# Patient Record
Sex: Female | Born: 1970 | Race: Black or African American | Hispanic: No | Marital: Single | Smoking: Never smoker
Health system: Southern US, Community
[De-identification: ages and names within clinical notes are randomized; demographics above are authoritative.]

## PROBLEM LIST (undated history)

## (undated) DIAGNOSIS — G7111 Myotonic muscular dystrophy: Secondary | ICD-10-CM

## (undated) DIAGNOSIS — J189 Pneumonia, unspecified organism: Secondary | ICD-10-CM

## (undated) DIAGNOSIS — F99 Mental disorder, not otherwise specified: Secondary | ICD-10-CM

## (undated) DIAGNOSIS — D219 Benign neoplasm of connective and other soft tissue, unspecified: Secondary | ICD-10-CM

## (undated) HISTORY — PX: OTHER SURGICAL HISTORY: SHX169

## (undated) HISTORY — DX: Myotonic muscular dystrophy: G71.11

---

## 2000-06-08 ENCOUNTER — Encounter: Payer: Self-pay | Admitting: Ophthalmology

## 2000-06-11 ENCOUNTER — Ambulatory Visit (HOSPITAL_COMMUNITY): Admission: RE | Admit: 2000-06-11 | Discharge: 2000-06-11 | Payer: Self-pay | Admitting: Ophthalmology

## 2001-04-26 ENCOUNTER — Ambulatory Visit (HOSPITAL_COMMUNITY): Admission: RE | Admit: 2001-04-26 | Discharge: 2001-04-26 | Payer: Self-pay | Admitting: Ophthalmology

## 2001-05-17 ENCOUNTER — Ambulatory Visit (HOSPITAL_COMMUNITY): Admission: RE | Admit: 2001-05-17 | Discharge: 2001-05-17 | Payer: Self-pay | Admitting: Ophthalmology

## 2003-03-13 ENCOUNTER — Other Ambulatory Visit: Admission: RE | Admit: 2003-03-13 | Discharge: 2003-03-13 | Payer: Self-pay | Admitting: Obstetrics and Gynecology

## 2003-04-02 ENCOUNTER — Emergency Department (HOSPITAL_COMMUNITY): Admission: EM | Admit: 2003-04-02 | Discharge: 2003-04-02 | Payer: Self-pay | Admitting: Family Medicine

## 2003-11-16 ENCOUNTER — Emergency Department (HOSPITAL_COMMUNITY): Admission: EM | Admit: 2003-11-16 | Discharge: 2003-11-16 | Payer: Self-pay | Admitting: Family Medicine

## 2004-03-29 ENCOUNTER — Emergency Department (HOSPITAL_COMMUNITY): Admission: EM | Admit: 2004-03-29 | Discharge: 2004-03-29 | Payer: Self-pay | Admitting: Family Medicine

## 2004-05-17 ENCOUNTER — Ambulatory Visit (HOSPITAL_COMMUNITY): Admission: RE | Admit: 2004-05-17 | Discharge: 2004-05-17 | Payer: Self-pay | Admitting: Internal Medicine

## 2004-10-31 ENCOUNTER — Other Ambulatory Visit: Admission: RE | Admit: 2004-10-31 | Discharge: 2004-10-31 | Payer: Self-pay | Admitting: Obstetrics and Gynecology

## 2004-11-20 ENCOUNTER — Ambulatory Visit: Payer: Self-pay | Admitting: Gastroenterology

## 2005-04-04 ENCOUNTER — Ambulatory Visit: Payer: Self-pay | Admitting: Gastroenterology

## 2005-04-07 ENCOUNTER — Ambulatory Visit: Payer: Self-pay | Admitting: Gastroenterology

## 2005-06-29 ENCOUNTER — Emergency Department (HOSPITAL_COMMUNITY): Admission: EM | Admit: 2005-06-29 | Discharge: 2005-06-29 | Payer: Self-pay | Admitting: Emergency Medicine

## 2005-07-09 ENCOUNTER — Ambulatory Visit (HOSPITAL_COMMUNITY): Admission: RE | Admit: 2005-07-09 | Discharge: 2005-07-09 | Payer: Self-pay | Admitting: Internal Medicine

## 2006-04-17 ENCOUNTER — Emergency Department (HOSPITAL_COMMUNITY): Admission: EM | Admit: 2006-04-17 | Discharge: 2006-04-17 | Payer: Self-pay | Admitting: Family Medicine

## 2006-12-17 ENCOUNTER — Ambulatory Visit (HOSPITAL_COMMUNITY): Admission: RE | Admit: 2006-12-17 | Discharge: 2006-12-17 | Payer: Self-pay | Admitting: Ophthalmology

## 2007-01-26 ENCOUNTER — Ambulatory Visit (HOSPITAL_COMMUNITY): Admission: RE | Admit: 2007-01-26 | Discharge: 2007-01-26 | Payer: Self-pay | Admitting: Ophthalmology

## 2007-03-26 ENCOUNTER — Encounter: Admission: RE | Admit: 2007-03-26 | Discharge: 2007-03-26 | Payer: Self-pay | Admitting: Obstetrics and Gynecology

## 2007-04-02 ENCOUNTER — Encounter (INDEPENDENT_AMBULATORY_CARE_PROVIDER_SITE_OTHER): Payer: Self-pay | Admitting: Obstetrics and Gynecology

## 2007-04-02 ENCOUNTER — Ambulatory Visit (HOSPITAL_COMMUNITY): Admission: RE | Admit: 2007-04-02 | Discharge: 2007-04-02 | Payer: Self-pay | Admitting: Obstetrics and Gynecology

## 2008-06-19 ENCOUNTER — Encounter: Admission: RE | Admit: 2008-06-19 | Discharge: 2008-06-19 | Payer: Self-pay | Admitting: Obstetrics and Gynecology

## 2008-12-29 ENCOUNTER — Emergency Department (HOSPITAL_COMMUNITY): Admission: EM | Admit: 2008-12-29 | Discharge: 2008-12-29 | Payer: Self-pay | Admitting: Emergency Medicine

## 2010-03-03 ENCOUNTER — Encounter: Payer: Self-pay | Admitting: Obstetrics and Gynecology

## 2010-03-03 ENCOUNTER — Encounter: Payer: Self-pay | Admitting: Internal Medicine

## 2010-06-25 NOTE — H&P (Signed)
NAMEHENDY, King                 ACCOUNT NO.:  [id number]   MEDICAL RECORD NO.:  [id number]          PATIENT TYPE:  AMB   LOCATION:  SDC                           FACILITY:  [facility]   PHYSICIAN:  Janine Limbo, M.D.DATE OF BIRTH:  [birth year]   DATE OF ADMISSION:  04/02/2007  DATE OF DISCHARGE:                              HISTORY & PHYSICAL   HISTORY OF PRESENT ILLNESS:  [name] is a 40 year old female, gravida  0, who presents for hysteroscopy with resection of a fibroid. She will  also have a dilatation and curettage and an endometrial ablation using  ThermaChoice.  The patient has been followed at the [facility] of [facility].  The patient is  mentally challenged and she is cared for by her mother.  The patient has  a history of fibroids and complains of menorrhagia and dysmenorrhea.  She does not tolerate exams well in the office.  Her most recent Pap  smear is within normal limits.  The patient is not sexually active and  there is no known history of abuse.   DRUG ALLERGIES:  No known drug allergies.   PAST MEDICAL HISTORY:  The patient is mentally challenged and she is  under constant care.   SOCIAL HISTORY:  The patient's mother denies cigarette use, alcohol use,  and recreational drug use.   REVIEW OF SYSTEMS:  Please see history of present illness.   FAMILY HISTORY:  Noncontributory.   PHYSICAL EXAM:  Weight is 109 pounds.  HEENT:  Within normal limits.  CHEST:  Clear.  HEART:  Regular rate and rhythm.  BREASTS:  Without masses.  ABDOMEN:  Nontender.  EXTREMITIES:  Grossly normal.  NEUROLOGIC:  Grossly normal except that clearly she does not comprehend  the conversations and the exam.   LABORATORY VALUES:  An ultrasound was performed and it showed an 8.46 cm  x 5.92 cm uterus.  The endometrium measured 0.46 cm. The ovaries  appeared normal.  There was a 2.30 cm fibroid noted.  A gonorrhea  culture was negative,  chlamydia culture was negative.   ASSESSMENT:  1. Fibroid uterus.  2. Dysmenorrhea.  3. Menorrhagia.  4. Mentally challenged.   PLAN:  A long discussion was held with the patient (who has limited  understanding) and her mother (health care power of attorney).  We  discussed our options for management and the mother wishes to proceed  with hysteroscopy, resection, dilatation and curettage, and endometrial  ablation.  She understands the indications for the surgical  procedure and she also understands that childbearing should not be an  option. There is no doubt that this patient should never bear children.  The mother accepts the risks of, but not limited to, anesthetic  complications, bleeding, infections, and possible damage to the  surrounding organs.      Janine Limbo, M.D.  Electronically Signed     AVS/MEDQ  D:  03/17/2007  T:  03/18/2007  Job:  [id number]

## 2010-06-25 NOTE — Op Note (Signed)
[name], [name]                 ACCOUNT NO.:  [id number]   MEDICAL RECORD NO.:  [id number]          PATIENT TYPE:  AMB   LOCATION:  SDC                           FACILITY:  [facility]   PHYSICIAN:  Janine Limbo, M.D.DATE OF BIRTH:  23-Jan-1971   DATE OF PROCEDURE:  04/02/2007  DATE OF DISCHARGE:                               OPERATIVE REPORT   PREOPERATIVE DIAGNOSES:  1. Menorrhagia  2. Dysmenorrhea.  3. Fibroid uterus.  4. Mentally challenged   POSTOPERATIVE DIAGNOSES:  1. Menorrhagia  2. Dysmenorrhea.  3. Fibroid uterus.  4. Mentally challenged   OPERATION PERFORMED:  1. Hysteroscopy.  2. Dilatation and curettage.  3. ThermaChoice endometrial ablation.   SURGEON:  Leonard Schwartz, M.D.   FIRST ASSISTANT:  None.   ANESTHESIA:  The anesthetic is general.   INDICATIONS FOR THE OPERATION:  This patient is a 40 year old female,  gravida 0, who presents with the above-mentioned diagnoses.  The patient  is mentally challenged and her mother has health care Power of  [place].  We discussed the indications and the options for management.  The risks of the above procedure were reviewed including, but not  limited to, anesthetic complications, bleeding, infection, and possible  damage to the surrounding organs.   FINDINGS:  The uterus was in normal size.  No lesions were noted in the  endometrial cavity.  No pathology was noted.   DESCRIPTION OF THE OPERATION:  The patient was brought to the operating  room where a general anesthetic was given.  The patient's abdomen,  perineum and vagina were prepped with multiple layers of Betadine.  The  bladder was drained of urine.  The patient was sterilely draped.   Examination under anesthesia was performed.  A paracervical block was  placed using 10 mL of half percent Marcaine with epinephrine.  An  endocervical curettage was obtained.  The uterus sounded to 7 cm.  The  cervix was gently dilated.   The diagnostic  hysteroscope was inserted and the cavity was carefully  inspected.  Pictures were taken.  No lesions were noted.  We were ready  then to proceed with the curettage.  The hysteroscope was removed and  the cavity was then curetted until it was felt to be clean using a small  curet.  We were then ready to proceed with the ThermaChoice procedure.   The ThermaChoice instrument was then tested and was noted to be working  in proper order.  The instrument was inserted into the uterus without  difficulty and then the cavity was ablated for total of 8 minutes.   The patient tolerated her procedure well.  Hemostasis was noted to be  adequate at the end of our procedure.  All instruments were removed.  Examination was repeated and the uterus was noted to be firm and no  masses were appreciated.  Estrogen cream was placed at the introitus to  help with healing.   The patient was awakened from her anesthetic without difficulty and  taken to the recovery room in stable condition.   Sponge, needle and instrument counts  were correct on two occasions.   ESTIMATED BLOOD LOSS:  The estimated blood loss was 5 mL.   FLUIDS REPLACED:  The estimated fluid deficit was less than 100 mL.   DISPOSITION:  The patient tolerated her procedure well.  The the patient  was taken to the recovery room in stable condition.   SPECIMENS:  The endocervical curettage and endometrial curettings were  sent to pathology for evaluation.   FOLLOW UP:  The follow up instructions are that the patient will return  to see Dr. Stefano Gaul in 2 weeks for follow up examination.   DISCHARGE INSTRUCTIONS:  The patient was given a copy of the  postoperative instruction sheet as prepared by the [facility]  [facility] for patients who have undergone dilatation and curettage.   DISCHARGE MEDICATIONS:  1. The patient was given a prescription for Motrin; and, she will take      600 mg every 6 hours as needed for mild-to-moderate  pain.  2. The patient will take Percocet 5/325, one tablet every 4 hours as      needed for severe pain.   NOTE:  The patient's mother states there is no way that she can be  sexually active and we will reiterate how important it is that this  patient never becomes pregnant in the future.      Janine Limbo, M.D.  Electronically Signed     AVS/MEDQ  D:  04/02/2007  T:  04/03/2007  Job:  [id number]

## 2010-06-28 NOTE — Op Note (Signed)
[place]  Patient:    [name], [name]                        MRN: [id number] Adm. Date:  [id number] Attending:  Karenann Cai                           Operative Report  PREOPERATIVE DIAGNOSIS:  Immature cataract, right eye.  POSTOPERATIVE DIAGNOSIS:  Immature cataract, right  eye.  PROCEDURE:  Kelman phacoemulsification of cataract, right eye.  ANESTHESIA:  General.  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 She was evaluated and found to have a visual acuity best of light perception in each eye.  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  Cataract extraction of the right eye was recommended, and she is admitted at this time for cataract extraction under general anesthesia.  DESCRIPTION OF PROCEDURE:  Under the influence of general inhalation anesthesia, the patient was prepped and draped in the usual manner.  The lid speculum was inserted under the upper and lower lid of the left eye, and a 4-0 silk traction suture was passed through the belly of the superior rectus muscle for retraction.  A fornix-based conjunctival flap was turned, and was hemostasis achieved by using cautery.  An incision was made in the sclera approximately 1 mm posterior to the limbus.  This incision was dissected down to clear cornea using the crescent blade.  A side port incision was made at the 1:30 clock hour position.  Occucoat was injected into the eye through the side port incision.  The anterior chamber was then entered through the corneoscleral tunnel incision at the 11:30 position using a 3.2 mm keratome. An anterior capsulotomy was done using a bent 25-gauge needle.  The nucleus was hydrodissected using Xylocaine.  The KPE handpiece was passed into the eye, and the nucleus  was emulsified without difficulty.  The residual cortical material was aspirated.  The posterior capsule was polished using the olive-tip polisher.  The wound was widened slightly to accommodate a foldable silicone lens.  This lens was seated into the eye behind the iris without difficulty.  The anterior chamber was reformed, and the pupil was constricted using Miochol.  The residual Occucoat was aspirated from the eye.  The wound was then tested, and there was found to be a small leak.  Therefore, the wound was closed by using a single horizontal suture of 10-0 nylon. The conjunctiva was closed using thermal cautery.  Celestone 1 cc and 0.5 cc of gentamicin were injected subconjunctivally.  Maxitrol ophthalmic ointment and Pilopine ointment were applied along with a patch and Fox shield.  The patient tolerated the procedure well, was discharged to postanesthesia recovery in satisfactory condition.  She is to be discharged when stable.  The parent is instructed to have the young lady refrain from any strenuous activity such as stooping, bending, lifting.  She is to keep the eye patched until I see her tomorrow for further evaluation.  DISCHARGE DIAGNOSIS:  Immature cataract, left eye. DD:  06/11/00 TD:  06/13/00 Job: [id number]

## 2010-06-28 NOTE — Op Note (Signed)
[place]  Patient:    [name], [name] Visit Number: [id number] MRN: [id number]          Service Type: DSU Location: [facility] 2852 [id number] Attending Physician:  Karenann Cai Dictated by:   Marya Landry Carlyle Lipa., M.D. Admit Date:  05/17/2001                             Operative Report  PREOPERATIVE DIAGNOSIS:  Mature cataract, left eye.  POSTOPERATIVE DIAGNOSIS:  Mature cataract, left eye.  PROCEDURE:  Kelman phacoemulsification of cataract, left eye.  ANESTHESIA:  General.  JUSTIFICATION FOR PROCEDURE:  This is a 40 year old retarded female who underwent a cataract extraction from the right eye approximately one year ago. At that time she had dense cataract in the left eye also, but she was lost to follow-up.  She presented recently, at which time she was able to see much better than she had been prior to her previous surgery, but the cataract in the left eye had indeed gotten worse.   Cataract extraction from the left eye was recommended.  She had a visual acuity best corrected to light perception in that left eye.  She is admitted at this time for that purpose.  DESCRIPTION OF PROCEDURE:  Under the influence of general inhalation anesthesia, the patient was prepped and draped in the usual manner.  The lid speculum was inserted under the upper and lower lid of the left eye, and a 4-0 silk traction suture was passed through the belly of the superior rectus muscle for traction.  A fornix-based conjunctival flap was turned and hemostasis was achieved by using cautery.  An incision made in the sclera approximately 1 mm posterior to the limbus.  This incision was dissected down to clear cornea using the crescent blade.  A side port incision was made at the 1:30 clock hour position.  Occucoat was injected into the eye through the side port incision.  The anterior chamber was then entered through the corneoscleral tunnel incision at  the 11:30 position.   An anterior capsulotomy was done using a bent 25-gauge needle.  The lens was then phacoemulsified using full aspiration and phaco power without complications.  The posterior capsule was polished using the olive-tip polisher.  The wound was widened slightly to accommodate a foldable silicone lens.  This lens was seated into the eye behind the iris without difficulty.  The anterior chamber was reformed, and the pupil was constricted using Miochol.  The residual Occucoat was aspirated from the eye.  The lips of the wound were hydrated with saline and tested to make sure that there was no leak.  After ascertaining that there was no leak, the conjunctiva was closed over the wounds using thermal cautery. Celestone 1 cc and 0.5 cc of gentamicin were injected subconjunctivally. Maxitrol ophthalmic ointment and Pilopine ointment were applied along with a patch and Fox shield.  The patient tolerated the procedure well and was discharged to postanesthesia recovery room in satisfactory condition.  The family was instructed that she was to refrain from any strenuous activity such as stooping, bending, or lifting.   She was to take Vicodin every four hours as needed for pain, and to see me in my office tomorrow for further evaluation.  DISCHARGE DIAGNOSIS:  Mature cataract, left eye. Dictated by:   Marya Landry Carlyle Lipa., M.D. Attending Physician:  Karenann Cai DD:  05/17/01 TD:  05/18/01 Job: [id number]

## 2010-11-01 LAB — URINE MICROSCOPIC-ADD ON

## 2010-11-01 LAB — CBC
Hemoglobin: 11.7 — ABNORMAL LOW
MCHC: 32.8
MCV: 79.8
RBC: 4.47
WBC: 4.1

## 2010-11-01 LAB — URINALYSIS, ROUTINE W REFLEX MICROSCOPIC
Glucose, UA: NEGATIVE
Ketones, ur: NEGATIVE
Specific Gravity, Urine: >1.03 — ABNORMAL HIGH
pH: 5.5

## 2011-07-24 ENCOUNTER — Other Ambulatory Visit: Payer: Self-pay | Admitting: Internal Medicine

## 2011-07-24 DIAGNOSIS — Z1231 Encounter for screening mammogram for malignant neoplasm of breast: Secondary | ICD-10-CM

## 2011-08-19 ENCOUNTER — Ambulatory Visit
Admission: RE | Admit: 2011-08-19 | Discharge: 2011-08-19 | Disposition: A | Discharge status: Home / Self Care | Payer: Medicaid Other | Source: Ambulatory Visit | Attending: Internal Medicine | Admitting: Internal Medicine

## 2011-08-19 DIAGNOSIS — Z1231 Encounter for screening mammogram for malignant neoplasm of breast: Secondary | ICD-10-CM

## 2011-09-02 ENCOUNTER — Encounter (HOSPITAL_COMMUNITY): Payer: Self-pay

## 2011-09-02 ENCOUNTER — Emergency Department (HOSPITAL_COMMUNITY)
Admission: EM | Admit: 2011-09-02 | Discharge: 2011-09-02 | Disposition: A | Discharge status: Home / Self Care | Payer: Medicaid Other | Source: Home / Self Care | Attending: Emergency Medicine | Admitting: Emergency Medicine

## 2011-09-02 ENCOUNTER — Emergency Department (INDEPENDENT_AMBULATORY_CARE_PROVIDER_SITE_OTHER): Payer: Medicaid Other

## 2011-09-02 DIAGNOSIS — J209 Acute bronchitis, unspecified: Secondary | ICD-10-CM

## 2011-09-02 HISTORY — DX: Pneumonia, unspecified organism: J18.9

## 2011-09-02 HISTORY — DX: Benign neoplasm of connective and other soft tissue, unspecified: D21.9

## 2011-09-02 MED ORDER — BENZONATATE 200 MG PO CAPS: 200.0000 mg | ORAL_CAPSULE | Freq: Three times a day (TID) | ORAL | Status: AC | PRN

## 2011-09-02 MED ORDER — AZITHROMYCIN 250 MG PO TABS: ORAL_TABLET | ORAL | Status: AC

## 2011-09-02 NOTE — ED Notes (Signed)
Family member states pt has runny nose, chest congestion and non-productive cough for 2 weeks.  States she has had pneumonia in the past.  Appetite like normal, subjective low grade fever last week.

## 2011-09-02 NOTE — ED Provider Notes (Signed)
Chief Complaint  Patient presents with  . Cough    History of Present Illness:   [name] is a 41 year old female with a two-week history of a loose, rattly cough. She denies any sputum production. She's had no chest tightness, wheezing, or chest pain. No history of asthma or cigarette smoking. She's also had a low-grade fever, rhinorrhea, and a fever blister on her lip. She denies sore throat, postnasal drip, hoarseness, nasal congestion, headache, abdominal pain, nausea, vomiting, or diarrhea.  Review of Systems:  Other than noted above, the patient denies any of the following symptoms. Systemic:  No fever, chills, sweats, fatigue, myalgias, headache, or anorexia. Eye:  No redness, pain or drainage. ENT:  No earache, ear congestion, nasal congestion, sneezing, rhinorrhea, sinus pressure, sinus pain, post nasal drip, or sore throat. Lungs:  No cough, sputum production, wheezing, shortness of breath, or chest pain. GI:  No abdominal pain, nausea, vomiting, or diarrhea. Skin:  No rash or itching.  PMFSH:  Past medical history, family history, social history, meds, and allergies were reviewed.  Physical Exam:   Vital signs:  BP 136/80  Pulse 77  Temp 98.6 F (37 C) (Oral)  Resp 16  SpO2 99%  LMP 08/19/2011 General:  Alert, in no distress. Eye:  No conjunctival injection or drainage. Lids were normal. ENT:  TMs and canals were normal, without erythema or inflammation.  Nasal mucosa was clear and uncongested, without drainage.  Mucous membranes were moist.  Pharynx was clear, without exudate or drainage.  There were no oral ulcerations or lesions. Neck:  Supple, no adenopathy, tenderness or mass. Lungs:  No respiratory distress.  Lungs were clear to auscultation, without wheezes, rales or rhonchi.  Breath sounds were clear and equal bilaterally. Lungs were resonant to percussion.  No egophony. Heart:  Regular rhythm, without gallops, murmers or rubs. Skin:  Clear, warm, and dry, without rash  or lesions.  Radiology:  Dg Chest 2 View  09/02/2011  *RADIOLOGY REPORT*  Clinical Data: Chest congestion, cough and low grade fever.  CHEST - 2 VIEW  Comparison: PA and lateral chest 07/09/2005.  Findings: Lungs are clear.  Heart size is normal.  No pneumothorax or pleural fluid.  Thoracolumbar scoliosis noted.  IMPRESSION: No acute finding.  Scoliosis.  Original Report Authenticated By: Bernadene Stepanian. Maricela Curet, M.D.    Assessment:  The encounter diagnosis was Acute bronchitis.  Plan:   1.  The following meds were prescribed:   New Prescriptions   AZITHROMYCIN (ZITHROMAX Z-PAK) 250 MG TABLET    Take as directed.   BENZONATATE (TESSALON) 200 MG CAPSULE    Take 1 capsule (200 mg total) by mouth 3 (three) times daily as needed for cough.   2.  The patient was instructed in symptomatic care and handouts were given. 3.  The patient was told to return if becoming worse in any way, if no better in 3 or 4 days, and given some red flag symptoms that would indicate earlier return.   Reuben Likes, MD 09/02/11 2029

## 2012-09-22 ENCOUNTER — Other Ambulatory Visit: Payer: Self-pay | Admitting: Family Medicine

## 2012-09-22 DIAGNOSIS — Z1231 Encounter for screening mammogram for malignant neoplasm of breast: Secondary | ICD-10-CM

## 2012-10-08 ENCOUNTER — Ambulatory Visit: Payer: Medicaid Other

## 2012-10-29 ENCOUNTER — Other Ambulatory Visit: Payer: Self-pay | Admitting: Orthopedic Surgery

## 2012-10-29 ENCOUNTER — Ambulatory Visit
Admission: RE | Admit: 2012-10-29 | Discharge: 2012-10-29 | Disposition: A | Discharge status: Home / Self Care | Payer: Medicaid Other | Source: Ambulatory Visit | Attending: Orthopedic Surgery | Admitting: Orthopedic Surgery

## 2012-10-29 DIAGNOSIS — M25661 Stiffness of right knee, not elsewhere classified: Secondary | ICD-10-CM

## 2012-10-29 DIAGNOSIS — R52 Pain, unspecified: Secondary | ICD-10-CM

## 2013-04-01 ENCOUNTER — Inpatient Hospital Stay: Admission: RE | Admit: 2013-04-01 | Payer: Medicaid Other | Source: Ambulatory Visit

## 2013-06-21 ENCOUNTER — Other Ambulatory Visit (HOSPITAL_COMMUNITY): Payer: Self-pay | Admitting: Family Medicine

## 2013-06-21 DIAGNOSIS — Z1231 Encounter for screening mammogram for malignant neoplasm of breast: Secondary | ICD-10-CM

## 2013-06-28 ENCOUNTER — Ambulatory Visit (HOSPITAL_COMMUNITY): Payer: Medicaid Other | Attending: Family Medicine

## 2013-09-29 ENCOUNTER — Ambulatory Visit: Payer: Self-pay | Admitting: Obstetrics & Gynecology

## 2014-01-13 ENCOUNTER — Other Ambulatory Visit: Payer: Self-pay | Admitting: Family Medicine

## 2014-01-13 DIAGNOSIS — Z1231 Encounter for screening mammogram for malignant neoplasm of breast: Secondary | ICD-10-CM

## 2014-01-23 ENCOUNTER — Emergency Department (HOSPITAL_COMMUNITY)
Admission: EM | Admit: 2014-01-23 | Discharge: 2014-01-23 | Disposition: A | Discharge status: Home / Self Care | Payer: Medicaid Other | Source: Home / Self Care | Attending: Emergency Medicine | Admitting: Emergency Medicine

## 2014-01-23 ENCOUNTER — Encounter (HOSPITAL_COMMUNITY): Payer: Self-pay | Admitting: Emergency Medicine

## 2014-01-23 DIAGNOSIS — J019 Acute sinusitis, unspecified: Secondary | ICD-10-CM

## 2014-01-23 DIAGNOSIS — J209 Acute bronchitis, unspecified: Secondary | ICD-10-CM

## 2014-01-23 DIAGNOSIS — J069 Acute upper respiratory infection, unspecified: Secondary | ICD-10-CM

## 2014-01-23 MED ORDER — TRAMADOL HCL 50 MG PO TABS: ORAL_TABLET | ORAL | Status: DC

## 2014-01-23 MED ORDER — AMOXICILLIN 500 MG PO CAPS: 500.0000 mg | ORAL_CAPSULE | Freq: Three times a day (TID) | ORAL | Status: DC

## 2014-01-23 NOTE — ED Provider Notes (Signed)
  Chief Complaint   URI   History of Present Illness   [name] is a 43 year old developmentally delayed female who comes in today with her group home worker. The group home worker states that she's had a one-week history of cough productive yellow sputum, fatigue, has complained of being cold and hot. She is not complaining of any pain anywhere, specifically no headache, stiff neck, chest pain, or abdominal pain. She's not had knee object and fever. She's had no nasal congestion, rhinorrhea, earache, sore throat, or difficulty breathing. No nausea, vomiting, or diarrhea.  Review of Systems   Other than as noted above, the patient denies any of the following symptoms: Systemic:  No fevers, chills, sweats, or myalgias. Eye:  No redness or discharge. ENT:  No ear pain, headache, nasal congestion, drainage, sinus pressure, or sore throat. Neck:  No neck pain, stiffness, or swollen glands. Lungs:  No cough, sputum production, hemoptysis, wheezing, chest tightness, shortness of breath or chest pain. GI:  No abdominal pain, nausea, vomiting or diarrhea.  [place]   Past medical history, family history, social history, meds, and allergies were reviewed.   Physical exam   Vital signs:  BP 121/84 mmHg  Pulse 84  Temp(Src) 98.2 F (36.8 C) (Oral)  Resp 16  SpO2 97% General:  Alert and oriented.  In no distress.  Skin warm and dry. Eye:  No conjunctival injection or drainage. Lids were normal. ENT:  TMs and canals were normal, without erythema or inflammation.  Nasal mucosa was clear and uncongested, without drainage.  Mucous membranes were moist.  Pharynx was clear with no exudate or drainage.  There were no oral ulcerations or lesions. Neck:  Supple, no adenopathy, tenderness or mass. Lungs:  No respiratory distress.  Lungs were clear to auscultation, without wheezes, rales or rhonchi.  Breath sounds were clear and equal bilaterally.  Heart:  Regular rhythm, without gallops, murmers or  rubs. Skin:  Clear, warm, and dry, without rash or lesions.   Assessment     The primary encounter diagnosis was Viral URI. Diagnoses of Acute sinusitis, recurrence not specified, unspecified location and Acute bronchitis, unspecified organism were also pertinent to this visit.  There is no evidence of pneumonia, strep throat, sinusitis, otitis media.    Plan    1.  Meds:  The following meds were prescribed:   Discharge Medication List as of 01/23/2014 12:29 PM    START taking these medications   Details  amoxicillin (AMOXIL) 500 MG capsule Take 1 capsule (500 mg total) by mouth 3 (three) times daily., Starting 01/23/2014, Until Discontinued, Normal    traMADol (ULTRAM) 50 MG tablet 1 to 2 every 8 hours as needed for cough, Print        2.  Patient Education/Counseling:  The patient was given appropriate handouts, self care instructions, and instructed in symptomatic relief.  Instructed to get extra fluids and extra rest.    3.  Follow up:  The patient was told to follow up here if no better in 3 to 4 days, or sooner if becoming worse in any way, and given some red flag symptoms such as increasing fever, difficulty breathing, chest pain, or persistent vomiting which would prompt immediate return.       Harden Mo, MD 01/23/14 [phone]

## 2014-01-23 NOTE — Discharge Instructions (Signed)
Acute Bronchitis Bronchitis is when the airways that extend from the windpipe into the lungs get red, puffy, and painful (inflamed). Bronchitis often causes thick spit (mucus) to develop. This leads to a cough. A cough is the most common symptom of bronchitis. In acute bronchitis, the condition usually begins suddenly and goes away over time (usually in 2 weeks). Smoking, allergies, and asthma can make bronchitis worse. Repeated episodes of bronchitis may cause more lung problems. HOME CARE  Rest.  Drink enough fluids to keep your pee (urine) clear or pale yellow (unless you need to limit fluids as told by your doctor).  Only take over-the-counter or prescription medicines as told by your doctor.  Avoid smoking and secondhand smoke. These can make bronchitis worse. If you are a smoker, think about using nicotine gum or skin patches. Quitting smoking will help your lungs heal faster.  Reduce the chance of getting bronchitis again by:  Washing your hands often.  Avoiding people with cold symptoms.  Trying not to touch your hands to your mouth, nose, or eyes.  Follow up with your doctor as told. GET HELP IF: Your symptoms do not improve after 1 week of treatment. Symptoms include:  Cough.  Fever.  Coughing up thick spit.  Body aches.  Chest congestion.  Chills.  Shortness of breath.  Sore throat. GET HELP RIGHT AWAY IF:   You have an increased fever.  You have chills.  You have severe shortness of breath.  You have bloody thick spit (sputum).  You throw up (vomit) often.  You lose too much body fluid (dehydration).  You have a severe headache.  You faint. MAKE SURE YOU:   Understand these instructions.  Will watch your condition.  Will get help right away if you are not doing well or get worse. Document Released: 07/16/2007 Document Revised: 09/29/2012 Document Reviewed: 07/20/2012 [facility] Patient Information 2015 [place], [place]. This information is not  intended to replace advice given to you by your health care provider. Make sure you discuss any questions you have with your health care provider.  Sinusitis Sinusitis is redness, soreness, and inflammation of the paranasal sinuses. Paranasal sinuses are air pockets within the bones of your face (beneath the eyes, the middle of the forehead, or above the eyes). In healthy paranasal sinuses, mucus is able to drain out, and air is able to circulate through them by way of your nose. However, when your paranasal sinuses are inflamed, mucus and air can become trapped. This can allow bacteria and other germs to grow and cause infection. Sinusitis can develop quickly and last only a short time (acute) or continue over a long period (chronic). Sinusitis that lasts for more than 12 weeks is considered chronic.  CAUSES  Causes of sinusitis include:  Allergies.  Structural abnormalities, such as displacement of the cartilage that separates your nostrils (deviated septum), which can decrease the air flow through your nose and sinuses and affect sinus drainage.  Functional abnormalities, such as when the small hairs (cilia) that line your sinuses and help remove mucus do not work properly or are not present. SIGNS AND SYMPTOMS  Symptoms of acute and chronic sinusitis are the same. The primary symptoms are pain and pressure around the affected sinuses. Other symptoms include:  Upper toothache.  Earache.  Headache.  Bad breath.  Decreased sense of smell and taste.  A cough, which worsens when you are lying flat.  Fatigue.  Fever.  Thick drainage from your nose, which often is green  and may contain pus (purulent).  Swelling and warmth over the affected sinuses. DIAGNOSIS  Your health care provider will perform a physical exam. During the exam, your health care provider may:  Look in your nose for signs of abnormal growths in your nostrils (nasal polyps).  Tap over the affected sinus to check  for signs of infection.  View the inside of your sinuses (endoscopy) using an imaging device that has a light attached (endoscope). If your health care provider suspects that you have chronic sinusitis, one or more of the following tests may be recommended:  Allergy tests.  Nasal culture. A sample of mucus is taken from your nose, sent to a lab, and screened for bacteria.  Nasal cytology. A sample of mucus is taken from your nose and examined by your health care provider to determine if your sinusitis is related to an allergy. TREATMENT  Most cases of acute sinusitis are related to a viral infection and will resolve on their own within 10 days. Sometimes medicines are prescribed to help relieve symptoms (pain medicine, decongestants, nasal steroid sprays, or saline sprays).  However, for sinusitis related to a bacterial infection, your health care provider will prescribe antibiotic medicines. These are medicines that will help kill the bacteria causing the infection.  Rarely, sinusitis is caused by a fungal infection. In theses cases, your health care provider will prescribe antifungal medicine. For some cases of chronic sinusitis, surgery is needed. Generally, these are cases in which sinusitis recurs more than 3 times per year, despite other treatments. HOME CARE INSTRUCTIONS   Drink plenty of water. Water helps thin the mucus so your sinuses can drain more easily.  Use a humidifier.  Inhale steam 3 to 4 times a day (for example, sit in the bathroom with the shower running).  Apply a warm, moist washcloth to your face 3 to 4 times a day, or as directed by your health care provider.  Use saline nasal sprays to help moisten and clean your sinuses.  Take medicines only as directed by your health care provider.  If you were prescribed either an antibiotic or antifungal medicine, finish it all even if you start to feel better. SEEK IMMEDIATE MEDICAL CARE IF:  You have increasing pain or  severe headaches.  You have nausea, vomiting, or drowsiness.  You have swelling around your face.  You have vision problems.  You have a stiff neck.  You have difficulty breathing. MAKE SURE YOU:   Understand these instructions.  Will watch your condition.  Will get help right away if you are not doing well or get worse. Document Released: 01/27/2005 Document Revised: 06/13/2013 Document Reviewed: 02/11/2011 [facility] Patient Information 2015 [place], [place]. This information is not intended to replace advice given to you by your health care provider. Make sure you discuss any questions you have with your health care provider.

## 2014-01-23 NOTE — ED Notes (Signed)
Chest congestion, blowing yellow from nose, no fever, no specific complaints, but " just tired".  Onset last week

## 2014-02-01 ENCOUNTER — Ambulatory Visit: Payer: Medicaid Other

## 2014-02-06 ENCOUNTER — Encounter: Payer: Self-pay | Admitting: *Deleted

## 2014-02-07 ENCOUNTER — Encounter: Payer: Self-pay | Admitting: Obstetrics & Gynecology

## 2014-03-08 ENCOUNTER — Ambulatory Visit
Admission: RE | Admit: 2014-03-08 | Discharge: 2014-03-08 | Disposition: A | Discharge status: Home / Self Care | Payer: Medicaid Other | Source: Ambulatory Visit | Attending: Family Medicine | Admitting: Family Medicine

## 2014-03-08 DIAGNOSIS — Z1231 Encounter for screening mammogram for malignant neoplasm of breast: Secondary | ICD-10-CM

## 2014-04-25 ENCOUNTER — Other Ambulatory Visit (HOSPITAL_COMMUNITY): Payer: Self-pay | Admitting: Cardiology

## 2014-04-25 DIAGNOSIS — I078 Other rheumatic tricuspid valve diseases: Secondary | ICD-10-CM

## 2014-05-02 ENCOUNTER — Ambulatory Visit (HOSPITAL_COMMUNITY)
Admission: RE | Admit: 2014-05-02 | Discharge: 2014-05-02 | Disposition: A | Discharge status: Home / Self Care | Payer: Medicaid Other | Source: Ambulatory Visit | Attending: Cardiology | Admitting: Cardiology

## 2014-05-02 DIAGNOSIS — I079 Rheumatic tricuspid valve disease, unspecified: Secondary | ICD-10-CM | POA: Diagnosis not present

## 2014-05-02 DIAGNOSIS — I078 Other rheumatic tricuspid valve diseases: Secondary | ICD-10-CM

## 2014-05-02 LAB — CREATININE, SERUM: Creatinine, Ser: 0.59 mg/dL (ref 0.50–1.10)

## 2014-05-02 MED ORDER — GADOBENATE DIMEGLUMINE 529 MG/ML IV SOLN
15.0000 mL | Freq: Once | INTRAVENOUS | Status: AC | PRN
  Administered 2014-05-02: 15 mL via INTRAVENOUS

## 2014-06-29 ENCOUNTER — Ambulatory Visit: Payer: Medicaid Other | Admitting: Certified Nurse Midwife

## 2014-07-12 ENCOUNTER — Emergency Department (INDEPENDENT_AMBULATORY_CARE_PROVIDER_SITE_OTHER): Payer: Medicaid Other

## 2014-07-12 ENCOUNTER — Emergency Department (HOSPITAL_COMMUNITY)
Admission: EM | Admit: 2014-07-12 | Discharge: 2014-07-12 | Disposition: A | Discharge status: Home / Self Care | Payer: Medicaid Other | Source: Home / Self Care | Attending: Family Medicine | Admitting: Family Medicine

## 2014-07-12 ENCOUNTER — Encounter (HOSPITAL_COMMUNITY): Payer: Self-pay | Admitting: *Deleted

## 2014-07-12 DIAGNOSIS — J189 Pneumonia, unspecified organism: Secondary | ICD-10-CM

## 2014-07-12 MED ORDER — LEVOFLOXACIN 500 MG PO TABS: 500.0000 mg | ORAL_TABLET | Freq: Every day | ORAL | Status: DC

## 2014-07-12 MED ORDER — TRAMADOL HCL 50 MG PO TABS: 50.0000 mg | ORAL_TABLET | Freq: Every evening | ORAL | Status: DC | PRN

## 2014-07-12 NOTE — ED Notes (Signed)
Unable  To   Sign  Pt   Out  As  Discharge  Template   Would not  Pull  Up    Plan of  Care  Verbalized   With    Pt

## 2014-07-12 NOTE — ED Notes (Signed)
Pt   Reports         Symptoms  Of  Cough  /  Congested          X  1  Week        The  Cough  Is  Non productive     The  Symptoms    Are  Not  releived  By  otc  meds

## 2014-07-12 NOTE — ED Provider Notes (Signed)
[name] is a 44 y.o. female who presents to Urgent Care today for cough congestion and headache cold symptoms present for one week. Cough is nonproductive. No fevers or chills nausea vomiting or diarrhea. Cough is bothersome especially at night.   Past Medical History  Diagnosis Date  . Pneumonia   . Fibroids    Past Surgical History  Procedure Laterality Date  . Thumb surgery     History  Substance Use Topics  . Smoking status: Never Smoker   . Smokeless tobacco: Not on file  . Alcohol Use: No   ROS as above Medications: No current facility-administered medications for this encounter.   Current Outpatient Prescriptions  Medication Sig Dispense Refill  . amoxicillin (AMOXIL) 500 MG capsule Take 1 capsule (500 mg total) by mouth 3 (three) times daily. 30 capsule 0  . levofloxacin (LEVAQUIN) 500 MG tablet Take 1 tablet (500 mg total) by mouth daily. 7 tablet 0  . Multiple Vitamin (MULTIVITAMIN) capsule Take 1 capsule by mouth daily.    . traMADol (ULTRAM) 50 MG tablet Take 1 tablet (50 mg total) by mouth at bedtime as needed (cough). 10 tablet 0   No Known Allergies   Exam:  BP 135/82 mmHg  Pulse 97  Temp(Src) 97.9 F (36.6 C) (Oral)  Resp 12  SpO2 97%  LMP 07/12/2014 Gen: Well NAD HEENT: EOMI,  MMM clear nasal discharge Lungs: Normal work of breathing. CTABL Heart: RRR no MRG Abd: NABS, Soft. Nondistended, Nontender Exts: Brisk capillary refill, warm and well perfused.   No results found for this or any previous visit (from the past 24 hour(s)). Dg Chest 2 View  07/12/2014   CLINICAL DATA:  Cough and fever for 1 month.  EXAM: CHEST  2 VIEW  COMPARISON:  09/02/2011  FINDINGS: Pulmonary airspace disease is seen in both lower lobes, left side greater than right, which is new since previous study, suspicious for bilateral pneumonia.  No evidence of pleural effusion. Heart size is within normal limits. Mild thoracic dextroscoliosis again noted.  IMPRESSION: Left  greater than right lower lobe airspace disease, suspicious for bilateral pneumonia. Followup PA and lateral chest X-ray is recommended in 3-4 weeks following trial of antibiotic therapy to ensure resolution and exclude underlying malignancy.   Electronically Signed   By: Earle Gell M.D.   On: 07/12/2014 14:13    Assessment and Plan: 44 y.o. female with immediate or pneumonia. Treat with Levaquin. Tramadol for cough suppression. Continue over-the-counter medications as needed. Return as needed.  Discussed warning signs or symptoms. Please see discharge instructions. Patient expresses understanding.     Gregor Hams, MD 07/12/14 [phone]

## 2014-07-12 NOTE — Discharge Instructions (Signed)
Thank you for coming in today. Call or go to the emergency room if you get worse, have trouble breathing, have chest pains, or palpitations.  Use tramadol at bedtime for cough as needed.  Follow up with primary doctor in 3-4 weeks for repeat xray.   Pneumonia Pneumonia is an infection of the lungs.  CAUSES Pneumonia may be caused by bacteria or a virus. Usually, these infections are caused by breathing infectious particles into the lungs (respiratory tract). SIGNS AND SYMPTOMS   Cough.  Fever.  Chest pain.  Increased rate of breathing.  Wheezing.  Mucus production. DIAGNOSIS  If you have the common symptoms of pneumonia, your health care provider will typically confirm the diagnosis with a chest X-ray. The X-ray will show an abnormality in the lung (pulmonary infiltrate) if you have pneumonia. Other tests of your blood, urine, or sputum may be done to find the specific cause of your pneumonia. Your health care provider may also do tests (blood gases or pulse oximetry) to see how well your lungs are working. TREATMENT  Some forms of pneumonia may be spread to other people when you cough or sneeze. You may be asked to wear a mask before and during your exam. Pneumonia that is caused by bacteria is treated with antibiotic medicine. Pneumonia that is caused by the influenza virus may be treated with an antiviral medicine. Most other viral infections must run their course. These infections will not respond to antibiotics.  HOME CARE INSTRUCTIONS   Cough suppressants may be used if you are losing too much rest. However, coughing protects you by clearing your lungs. You should avoid using cough suppressants if you can.  Your health care provider may have prescribed medicine if he or she thinks your pneumonia is caused by bacteria or influenza. Finish your medicine even if you start to feel better.  Your health care provider may also prescribe an expectorant. This loosens the mucus to be  coughed up.  Take medicines only as directed by your health care provider.  Do not smoke. Smoking is a common cause of bronchitis and can contribute to pneumonia. If you are a smoker and continue to smoke, your cough may last several weeks after your pneumonia has cleared.  A cold steam vaporizer or humidifier in your room or home may help loosen mucus.  Coughing is often worse at night. Sleeping in a semi-upright position in a recliner or using a couple pillows under your head will help with this.  Get rest as you feel it is needed. Your body will usually let you know when you need to rest. PREVENTION A pneumococcal shot (vaccine) is available to prevent a common bacterial cause of pneumonia. This is usually suggested for:  People over 70 years old.  Patients on chemotherapy.  People with chronic lung problems, such as bronchitis or emphysema.  People with immune system problems. If you are over 65 or have a high risk condition, you may receive the pneumococcal vaccine if you have not received it before. In some countries, a routine influenza vaccine is also recommended. This vaccine can help prevent some cases of pneumonia.You may be offered the influenza vaccine as part of your care. If you smoke, it is time to quit. You may receive instructions on how to stop smoking. Your health care provider can provide medicines and counseling to help you quit. SEEK MEDICAL CARE IF: You have a fever. SEEK IMMEDIATE MEDICAL CARE IF:   Your illness becomes worse. This is  especially true if you are elderly or weakened from any other disease.  You cannot control your cough with suppressants and are losing sleep.  You begin coughing up blood.  You develop pain which is getting worse or is uncontrolled with medicines.  Any of the symptoms which initially brought you in for treatment are getting worse rather than better.  You develop shortness of breath or chest pain. MAKE SURE YOU:    Understand these instructions.  Will watch your condition.  Will get help right away if you are not doing well or get worse. Document Released: 01/27/2005 Document Revised: 06/13/2013 Document Reviewed: 04/18/2010 [facility] Patient Information 2015 [place], [place]. This information is not intended to replace advice given to you by your health care provider. Make sure you discuss any questions you have with your health care provider.

## 2014-07-13 ENCOUNTER — Ambulatory Visit: Payer: Self-pay | Admitting: Certified Nurse Midwife

## 2014-07-14 ENCOUNTER — Ambulatory Visit (INDEPENDENT_AMBULATORY_CARE_PROVIDER_SITE_OTHER): Payer: Medicaid Other | Admitting: Certified Nurse Midwife

## 2014-07-14 ENCOUNTER — Encounter: Payer: Self-pay | Admitting: Certified Nurse Midwife

## 2014-07-14 VITALS — BP 124/81 | HR 92 | Temp 97.9°F | Wt 96.0 lb

## 2014-07-14 DIAGNOSIS — J189 Pneumonia, unspecified organism: Secondary | ICD-10-CM

## 2014-07-14 DIAGNOSIS — L97519 Non-pressure chronic ulcer of other part of right foot with unspecified severity: Secondary | ICD-10-CM

## 2014-07-14 DIAGNOSIS — Z0000 Encounter for general adult medical examination without abnormal findings: Secondary | ICD-10-CM

## 2014-07-14 DIAGNOSIS — F79 Unspecified intellectual disabilities: Secondary | ICD-10-CM | POA: Insufficient documentation

## 2014-07-14 DIAGNOSIS — Z01419 Encounter for gynecological examination (general) (routine) without abnormal findings: Secondary | ICD-10-CM

## 2014-07-14 LAB — COMPREHENSIVE METABOLIC PANEL
ALT: 18 U/L (ref 0–35)
AST: 31 U/L (ref 0–37)
Albumin: 3.8 g/dL (ref 3.5–5.2)
Alkaline Phosphatase: 75 U/L (ref 39–117)
BUN: 9 mg/dL (ref 6–23)
CALCIUM: 9.3 mg/dL (ref 8.4–10.5)
CO2: 28 meq/L (ref 19–32)
Chloride: 95 mEq/L — ABNORMAL LOW (ref 96–112)
Creat: 0.5 mg/dL (ref 0.50–1.10)
GLUCOSE: 81 mg/dL (ref 70–99)
POTASSIUM: 4.7 meq/L (ref 3.5–5.3)
Sodium: 139 mEq/L (ref 135–145)
Total Bilirubin: 0.3 mg/dL (ref 0.2–1.2)
Total Protein: 8.1 g/dL (ref 6.0–8.3)

## 2014-07-14 LAB — CBC WITH DIFFERENTIAL/PLATELET
BASOS ABS: 0 10*3/uL (ref 0.0–0.1)
BASOS PCT: 0 % (ref 0–1)
Eosinophils Absolute: 0 10*3/uL (ref 0.0–0.7)
Eosinophils Relative: 1 % (ref 0–5)
HCT: 36.5 % (ref 36.0–46.0)
HEMOGLOBIN: 11 g/dL — AB (ref 12.0–15.0)
LYMPHS PCT: 28 % (ref 12–46)
Lymphs Abs: 1.3 10*3/uL (ref 0.7–4.0)
MCH: 23.7 pg — AB (ref 26.0–34.0)
MCHC: 30.1 g/dL (ref 30.0–36.0)
MCV: 78.7 fL (ref 78.0–100.0)
MONO ABS: 0.3 10*3/uL (ref 0.1–1.0)
MONOS PCT: 7 % (ref 3–12)
MPV: 9.3 fL (ref 8.6–12.4)
NEUTROS ABS: 3 10*3/uL (ref 1.7–7.7)
Neutrophils Relative %: 64 % (ref 43–77)
PLATELETS: 702 10*3/uL — AB (ref 150–400)
RBC: 4.64 MIL/uL (ref 3.87–5.11)
RDW: 16.1 % — ABNORMAL HIGH (ref 11.5–15.5)
WBC: 4.7 10*3/uL (ref 4.0–10.5)

## 2014-07-14 LAB — HIV ANTIBODY (ROUTINE TESTING W REFLEX): HIV: NONREACTIVE

## 2014-07-14 LAB — TSH: TSH: 1.278 u[IU]/mL (ref 0.350–4.500)

## 2014-07-14 MED ORDER — ALBUTEROL SULFATE HFA 108 (90 BASE) MCG/ACT IN AERS: 2.0000 | INHALATION_SPRAY | Freq: Four times a day (QID) | RESPIRATORY_TRACT | Status: DC | PRN

## 2014-07-14 MED ORDER — PRENATE MINI 18-0.6-0.4-350 MG PO CAPS: 1.0000 | ORAL_CAPSULE | Freq: Every day | ORAL | Status: DC

## 2014-07-14 MED ORDER — DEXTROMETHORPHAN-GUAIFENESIN 20-400 MG PO TABS: 2.0000 | ORAL_TABLET | Freq: Four times a day (QID) | ORAL | Status: DC | PRN

## 2014-07-14 NOTE — Progress Notes (Signed)
Patient ID: [name], female   DOB: [birth year], 44 y.o.   MRN: [id number]    Subjective:      [name] is a 44 y.o. female here for a routine exam.  Current complaints: recently dx with pneumonia.  Used to see Dr. Delsa Sale.  Not sexually active.  Lives with mom.  Periods last about 4-5, sometimes has cramping.  Denies any heavy clots does have some heavy bleeding in the beginning.  History given by mother.    Mother had BCA dx in her 65's and had radiation.  Mother desires PNV for her daughter.  Patient has slight mental retardation and CP.  Her mother desires full STD screening and annual examination.    Personal health questionnaire:  Is patient [name], have a family history of breast and/or ovarian cancer: yes Is there a family history of uterine cancer diagnosed at age < 62, gastrointestinal cancer, urinary tract cancer, family member who is a Field seismologist syndrome-associated carrier: no Is the patient overweight and hypertensive, family history of diabetes, personal history of gestational diabetes, preeclampsia or PCOS: no Is patient over 34, have PCOS,  family history of premature CHD under age 9, diabetes, smoke, have hypertension or peripheral artery disease:  no At any time, has a partner hit, kicked or otherwise hurt or frightened you?: no Over the past 2 weeks, have you felt down, depressed or hopeless?: no Over the past 2 weeks, have you felt little interest or pleasure in doing things?:no   Gynecologic History Patient's last menstrual period was 07/12/2014. Contraception: abstinence Last Pap: unknown. Results were: normal according to patients mother Last mammogram: 02/2014. Results were: normal  Obstetric History OB History  No data available  Never been pregnant  Past Medical History  Diagnosis Date  . Pneumonia   . Fibroids     Past Surgical History  Procedure Laterality Date  . Thumb surgery       Current outpatient prescriptions:  .   levofloxacin (LEVAQUIN) 500 MG tablet, Take 1 tablet (500 mg total) by mouth daily., Disp: 7 tablet, Rfl: 0 .  traMADol (ULTRAM) 50 MG tablet, Take 1 tablet (50 mg total) by mouth at bedtime as needed (cough)., Disp: 10 tablet, Rfl: 0 .  albuterol (PROVENTIL HFA;VENTOLIN HFA) 108 (90 BASE) MCG/ACT inhaler, Inhale 2 puffs into the lungs every 6 (six) hours as needed for wheezing or shortness of breath., Disp: 1 Inhaler, Rfl: 2 .  amoxicillin (AMOXIL) 500 MG capsule, Take 1 capsule (500 mg total) by mouth 3 (three) times daily., Disp: 30 capsule, Rfl: 0 .  Dextromethorphan-Guaifenesin 20-400 MG TABS, Take 2 tablets by mouth every 6 (six) hours as needed., Disp: 60 each, Rfl: 0 .  Multiple Vitamin (MULTIVITAMIN) capsule, Take 1 capsule by mouth daily., Disp: , Rfl:  .  Prenat-FeCbn-FeAsp-Meth-FA-DHA (PRENATE MINI) 18-0.6-0.4-350 MG CAPS, Take 1 capsule by mouth daily., Disp: 30 capsule, Rfl: 12 No Known Allergies  History  Substance Use Topics  . Smoking status: Never Smoker   . Smokeless tobacco: Not on file  . Alcohol Use: No    History reviewed. No pertinent family history.    Review of Systems  Constitutional: negative for fatigue and weight loss Respiratory: + for cough and wheezing Cardiovascular: negative for chest pain, fatigue and palpitations Gastrointestinal: negative for abdominal pain and change in bowel habits Musculoskeletal:negative for myalgias Neurological: negative for gait problems and tremors Behavioral/Psych: negative for abusive relationship, depression Endocrine: negative for temperature intolerance   Genitourinary:negative for  abnormal menstrual periods, genital lesions, hot flashes, sexual problems and vaginal discharge Integument/breast: negative for breast lump, breast tenderness, nipple discharge and skin lesion(s)    Objective:       BP 124/81 mmHg  Pulse 92  Temp(Src) 97.9 F (36.6 C)  Wt 96 lb (43.545 kg)  LMP 07/12/2014 General:   alert  Skin:    no rash, + right foot ulceration   Lungs:   + crackles with occasional wheezing/coughing  Heart:   regular rate and rhythm, S1, S2 normal, no murmur, click, rub or gallop  Breasts:   normal without suspicious masses, skin or nipple changes or axillary nodes  Abdomen:  normal findings: no organomegaly, soft, non-tender and no hernia  Pelvis:  External genitalia: normal general appearance Urinary system: urethral meatus normal and bladder without fullness, nontender Vaginal: normal without tenderness, induration or masses Cervix: normal appearance, lower in the vaginal vault.  Nulliparous.  Adnexa: normal bimanual exam Uterus: anteverted and non-tender, normal size   Lab Review Urine pregnancy test Labs reviewed yes Radiologic studies reviewed yes  50% of 30 min visit spent on counseling and coordination of care.   Assessment:    Healthy female exam.   Right foot healed ulceration ?scar from a shoe Recent dx of pneumonia   Plan:    Education reviewed: depression evaluation, self breast exams and skin cancer screening. Contraception: abstinence. Follow up in: 1 year.  Not due for mammogram until Jan. 2017  Meds ordered this encounter  Medications  . albuterol (PROVENTIL HFA;VENTOLIN HFA) 108 (90 BASE) MCG/ACT inhaler    Sig: Inhale 2 puffs into the lungs every 6 (six) hours as needed for wheezing or shortness of breath.    Dispense:  1 Inhaler    Refill:  2  . Dextromethorphan-Guaifenesin 20-400 MG TABS    Sig: Take 2 tablets by mouth every 6 (six) hours as needed.    Dispense:  60 each    Refill:  0  . Prenat-FeCbn-FeAsp-Meth-FA-DHA (PRENATE MINI) 18-0.6-0.4-350 MG CAPS    Sig: Take 1 capsule by mouth daily.    Dispense:  30 capsule    Refill:  12   Orders Placed This Encounter  Procedures  . SureSwab, Vaginosis/Vaginitis Plus  . HIV antibody (with reflex)  . CBC with Differential/Platelet  . Comprehensive metabolic panel  . TSH  . Hepatitis B surface antigen  .  RPR  . Hepatitis C antibody  . Hemoglobin A1C  . Ambulatory referral to Dermatology    Referral Priority:  Routine    Referral Type:  Consultation    Referral Reason:  Specialty Services Required    Requested Specialty:  Dermatology    Number of Visits Requested:  1

## 2014-07-15 LAB — HEMOGLOBIN A1C
Hgb A1c MFr Bld: 5.4 % (ref <5.7)
MEAN PLASMA GLUCOSE: 108 mg/dL (ref <117)

## 2014-07-15 LAB — RPR

## 2014-07-15 LAB — HEPATITIS B SURFACE ANTIGEN: Hepatitis B Surface Ag: NEGATIVE

## 2014-07-15 LAB — HEPATITIS C ANTIBODY: HCV Ab: NEGATIVE

## 2014-07-19 ENCOUNTER — Other Ambulatory Visit: Payer: Self-pay | Admitting: Certified Nurse Midwife

## 2014-07-19 DIAGNOSIS — N760 Acute vaginitis: Secondary | ICD-10-CM

## 2014-07-19 DIAGNOSIS — B9689 Other specified bacterial agents as the cause of diseases classified elsewhere: Secondary | ICD-10-CM

## 2014-07-19 LAB — PAP IG AND HPV HIGH-RISK: HPV DNA HIGH RISK: NOT DETECTED

## 2014-07-19 LAB — SURESWAB, VAGINOSIS/VAGINITIS PLUS
Atopobium vaginae: DETECTED Log (cells/mL)
C. ALBICANS, DNA: NOT DETECTED
C. GLABRATA, DNA: NOT DETECTED
C. PARAPSILOSIS, DNA: NOT DETECTED
C. trachomatis RNA, TMA: NOT DETECTED
C. tropicalis, DNA: NOT DETECTED
GARDNERELLA VAGINALIS: 5.8 Log (cells/mL)
LACTOBACILLUS SPECIES: NOT DETECTED Log (cells/mL)
MEGASPHAERA SPECIES: NOT DETECTED Log (cells/mL)
N. GONORRHOEAE RNA, TMA: NOT DETECTED
T. vaginalis RNA, QL TMA: NOT DETECTED

## 2014-07-19 MED ORDER — TINIDAZOLE 500 MG PO TABS: ORAL_TABLET | ORAL | Status: DC

## 2014-07-21 ENCOUNTER — Telehealth: Payer: Self-pay

## 2014-07-21 ENCOUNTER — Other Ambulatory Visit: Payer: Self-pay | Admitting: *Deleted

## 2014-07-21 DIAGNOSIS — N760 Acute vaginitis: Secondary | ICD-10-CM

## 2014-07-21 DIAGNOSIS — B9689 Other specified bacterial agents as the cause of diseases classified elsewhere: Secondary | ICD-10-CM

## 2014-07-21 MED ORDER — METRONIDAZOLE 500 MG PO TABS: 500.0000 mg | ORAL_TABLET | Freq: Two times a day (BID) | ORAL | Status: DC

## 2014-07-21 MED ORDER — TINIDAZOLE 500 MG PO TABS: ORAL_TABLET | ORAL | Status: DC

## 2014-07-21 NOTE — Progress Notes (Signed)
Metronidazole sent to pharmacy due to insurance.

## 2014-07-21 NOTE — Telephone Encounter (Signed)
patient has dermatology appt with Dr. Mickel Fuchs, [facility], on 08/24/14, 8am and has been notified

## 2015-02-21 ENCOUNTER — Other Ambulatory Visit: Payer: Self-pay

## 2015-02-21 DIAGNOSIS — Z1231 Encounter for screening mammogram for malignant neoplasm of breast: Secondary | ICD-10-CM

## 2015-03-13 ENCOUNTER — Ambulatory Visit
Admission: RE | Admit: 2015-03-13 | Discharge: 2015-03-13 | Disposition: A | Discharge status: Home / Self Care | Payer: Medicaid Other | Source: Ambulatory Visit

## 2015-03-13 DIAGNOSIS — Z1231 Encounter for screening mammogram for malignant neoplasm of breast: Secondary | ICD-10-CM

## 2015-04-24 ENCOUNTER — Emergency Department (INDEPENDENT_AMBULATORY_CARE_PROVIDER_SITE_OTHER)
Admission: EM | Admit: 2015-04-24 | Discharge: 2015-04-24 | Disposition: A | Discharge status: Other Acute Inpatient Hospital | Payer: Medicaid Other | Source: Home / Self Care | Attending: Family Medicine | Admitting: Family Medicine

## 2015-04-24 ENCOUNTER — Encounter (HOSPITAL_COMMUNITY): Payer: Self-pay | Admitting: *Deleted

## 2015-04-24 ENCOUNTER — Encounter (HOSPITAL_COMMUNITY): Payer: Self-pay

## 2015-04-24 ENCOUNTER — Emergency Department (INDEPENDENT_AMBULATORY_CARE_PROVIDER_SITE_OTHER): Payer: Medicaid Other

## 2015-04-24 DIAGNOSIS — Z79899 Other long term (current) drug therapy: Secondary | ICD-10-CM | POA: Insufficient documentation

## 2015-04-24 DIAGNOSIS — Z8701 Personal history of pneumonia (recurrent): Secondary | ICD-10-CM | POA: Insufficient documentation

## 2015-04-24 DIAGNOSIS — J159 Unspecified bacterial pneumonia: Secondary | ICD-10-CM | POA: Insufficient documentation

## 2015-04-24 DIAGNOSIS — Z86018 Personal history of other benign neoplasm: Secondary | ICD-10-CM | POA: Diagnosis not present

## 2015-04-24 DIAGNOSIS — J189 Pneumonia, unspecified organism: Secondary | ICD-10-CM | POA: Diagnosis not present

## 2015-04-24 DIAGNOSIS — R05 Cough: Secondary | ICD-10-CM | POA: Diagnosis present

## 2015-04-24 HISTORY — DX: Mental disorder, not otherwise specified: F99

## 2015-04-24 LAB — CBC WITH DIFFERENTIAL/PLATELET
Basophils Absolute: 0 10*3/uL (ref 0.0–0.1)
Basophils Relative: 0 %
EOS ABS: 0 10*3/uL (ref 0.0–0.7)
EOS PCT: 0 %
HCT: 33.1 % — ABNORMAL LOW (ref 36.0–46.0)
Hemoglobin: 9.5 g/dL — ABNORMAL LOW (ref 12.0–15.0)
LYMPHS ABS: 1.2 10*3/uL (ref 0.7–4.0)
Lymphocytes Relative: 19 %
MCH: 23.3 pg — ABNORMAL LOW (ref 26.0–34.0)
MCHC: 28.7 g/dL — AB (ref 30.0–36.0)
MCV: 81.1 fL (ref 78.0–100.0)
Monocytes Absolute: 0.4 10*3/uL (ref 0.1–1.0)
Monocytes Relative: 6 %
Neutro Abs: 4.6 10*3/uL (ref 1.7–7.7)
Neutrophils Relative %: 75 %
Platelets: 526 10*3/uL — ABNORMAL HIGH (ref 150–400)
RBC: 4.08 MIL/uL (ref 3.87–5.11)
RDW: 15.9 % — ABNORMAL HIGH (ref 11.5–15.5)
WBC: 6.2 10*3/uL (ref 4.0–10.5)

## 2015-04-24 LAB — COMPREHENSIVE METABOLIC PANEL
ALK PHOS: 60 U/L (ref 38–126)
ALT: 15 U/L (ref 14–54)
AST: 30 U/L (ref 15–41)
Albumin: 2.9 g/dL — ABNORMAL LOW (ref 3.5–5.0)
Anion gap: 13 (ref 5–15)
BILIRUBIN TOTAL: 0.4 mg/dL (ref 0.3–1.2)
BUN: 7 mg/dL (ref 6–20)
CALCIUM: 9 mg/dL (ref 8.9–10.3)
CO2: 29 mmol/L (ref 22–32)
Chloride: 101 mmol/L (ref 101–111)
Creatinine, Ser: 0.61 mg/dL (ref 0.44–1.00)
GFR calc non Af Amer: >60 mL/min (ref >60)
Glucose, Bld: 101 mg/dL — ABNORMAL HIGH (ref 65–99)
Potassium: 3.1 mmol/L — ABNORMAL LOW (ref 3.5–5.1)
SODIUM: 143 mmol/L (ref 135–145)
Total Protein: 7.1 g/dL (ref 6.5–8.1)

## 2015-04-24 LAB — I-STAT CG4 LACTIC ACID, ED: Lactic Acid, Venous: 0.77 mmol/L (ref 0.5–2.0)

## 2015-04-24 NOTE — ED Notes (Addendum)
Pt reports non-productive cough, onset about a week ago. Today she went to [facility] and was diagnosed with pneumonia and sent here. Denies pain.

## 2015-04-24 NOTE — ED Provider Notes (Addendum)
CSN: [id number]     Arrival date & time 04/24/15  1442 History   None    Chief Complaint  Patient presents with  . Cough   (Consider location/radiation/quality/duration/timing/severity/associated sxs/prior Treatment) Patient is a 45 y.o. female presenting with cough. The history is provided by the patient.  Cough Cough characteristics:  Non-productive Severity:  Moderate Onset quality:  Gradual Duration:  1 week Progression:  Unchanged Chronicity:  New Smoker: no   Context: upper respiratory infection   Relieved by:  None tried Worsened by:  Nothing tried Ineffective treatments:  None tried Associated symptoms: no chest pain, no fever, no rhinorrhea, no shortness of breath, no sinus congestion and no wheezing     Past Medical History  Diagnosis Date  . Pneumonia   . Fibroids   . Mental disorder    Past Surgical History  Procedure Laterality Date  . Thumb surgery     History reviewed. No pertinent family history. Social History  Substance Use Topics  . Smoking status: Never Smoker   . Smokeless tobacco: None  . Alcohol Use: No   OB History    No data available     Review of Systems  Constitutional: Negative.  Negative for fever.  HENT: Negative.  Negative for rhinorrhea.   Respiratory: Positive for cough. Negative for shortness of breath and wheezing.   Cardiovascular: Negative for chest pain.  All other systems reviewed and are negative.   Allergies  Review of patient's allergies indicates no known allergies.  Home Medications   Prior to Admission medications   Medication Sig Start Date End Date Taking? Authorizing Provider  albuterol (PROVENTIL HFA;VENTOLIN HFA) 108 (90 BASE) MCG/ACT inhaler Inhale 2 puffs into the lungs every 6 (six) hours as needed for wheezing or shortness of breath. 07/14/14   Rachelle A Denney, CNM  amoxicillin (AMOXIL) 500 MG capsule Take 1 capsule (500 mg total) by mouth 3 (three) times daily. 01/23/14   Harden Mo, MD   benzonatate (TESSALON) 100 MG capsule Take 1 capsule (100 mg total) by mouth 3 (three) times daily as needed for cough. 04/25/15   Kristen N Ward, DO  Dextromethorphan-Guaifenesin 20-400 MG TABS Take 2 tablets by mouth every 6 (six) hours as needed. 07/14/14   Rachelle A Denney, CNM  levofloxacin (LEVAQUIN) 750 MG tablet Take 1 tablet (750 mg total) by mouth daily. 04/25/15   Kristen N Ward, DO  metroNIDAZOLE (FLAGYL) 500 MG tablet Take 1 tablet (500 mg total) by mouth 2 (two) times daily. 07/21/14   Rachelle A Denney, CNM  Multiple Vitamin (MULTIVITAMIN) capsule Take 1 capsule by mouth daily.    Historical Provider, MD  Prenat-FeCbn-FeAsp-Meth-FA-DHA (PRENATE MINI) 18-0.6-0.4-350 MG CAPS Take 1 capsule by mouth daily. 07/14/14   Rachelle A Denney, CNM  traMADol (ULTRAM) 50 MG tablet Take 1 tablet (50 mg total) by mouth at bedtime as needed (cough). 07/12/14   Gregor Hams, MD   Meds Ordered and Administered this Visit  Medications - No data to display  BP 127/85 mmHg  Pulse 85  Temp(Src) 98.2 F (36.8 C) (Oral)  Resp 16  SpO2 96% No data found.   Physical Exam  Constitutional: She appears well-developed and well-nourished. No distress.  HENT:  Mouth/Throat: Oropharynx is clear and moist.  Neck: Normal range of motion. Neck supple.  Cardiovascular: Normal rate, normal heart sounds and intact distal pulses.   Pulmonary/Chest: Effort normal. She has decreased breath sounds. She has rhonchi. She exhibits no tenderness.  Lymphadenopathy:  She has no cervical adenopathy.  Neurological: She is alert.  Skin: Skin is warm and dry.  Nursing note and vitals reviewed.   ED Course  Procedures (including critical care time)  Labs Review Labs Reviewed - No data to display  Imaging Review No results found. X-rays reviewed and report per radiologist.   Visual Acuity Review  Right Eye Distance:   Left Eye Distance:   Bilateral Distance:    Right Eye Near:   Left Eye Near:    Bilateral  Near:         MDM   1. CAP (community acquired pneumonia)     Sent for eval of recurrent cap.   Billy Fischer, MD 04/24/15 1754  Billy Fischer, MD 05/01/15 1640  Billy Fischer, MD 05/01/15 [phone]

## 2015-04-24 NOTE — ED Notes (Signed)
Pt  Reports      Symptoms    Of  Cough  Congested        Pt  Reports         Symptoms     Of   Non  Productive  Cough         Symptoms   X    1  Week  The  Cough  Is  Rattling   And  The  Symptoms  Started  Off as  A  Cold

## 2015-04-25 ENCOUNTER — Emergency Department (HOSPITAL_COMMUNITY)
Admission: EM | Admit: 2015-04-25 | Discharge: 2015-04-25 | Disposition: A | Discharge status: Home / Self Care | Payer: Medicaid Other | Attending: Emergency Medicine | Admitting: Emergency Medicine

## 2015-04-25 DIAGNOSIS — J189 Pneumonia, unspecified organism: Secondary | ICD-10-CM

## 2015-04-25 MED ORDER — LEVOFLOXACIN 750 MG PO TABS: 750.0000 mg | ORAL_TABLET | Freq: Every day | ORAL | Status: DC

## 2015-04-25 MED ORDER — BENZONATATE 100 MG PO CAPS
ORAL_CAPSULE | ORAL | Status: AC
  Filled 2015-04-25: qty 1

## 2015-04-25 MED ORDER — LEVOFLOXACIN 750 MG PO TABS
750.0000 mg | ORAL_TABLET | Freq: Once | ORAL | Status: AC
  Administered 2015-04-25: 750 mg via ORAL

## 2015-04-25 MED ORDER — BENZONATATE 100 MG PO CAPS
100.0000 mg | ORAL_CAPSULE | Freq: Once | ORAL | Status: AC
  Administered 2015-04-25: 100 mg via ORAL

## 2015-04-25 MED ORDER — LEVOFLOXACIN 750 MG PO TABS
ORAL_TABLET | ORAL | Status: AC
  Filled 2015-04-25: qty 1

## 2015-04-25 MED ORDER — BENZONATATE 100 MG PO CAPS: 100.0000 mg | ORAL_CAPSULE | Freq: Three times a day (TID) | ORAL | Status: DC | PRN

## 2015-04-25 NOTE — Discharge Instructions (Signed)
You may alternate between Tylenol 1000 mg every 6 hours as needed for fever and pain and ibuprofen 800 mg every 8 hours as needed for fever and pain. Please follow-up with your primary care physician in one week.   Community-Acquired Pneumonia, Adult Pneumonia is an infection of the lungs. There are different types of pneumonia. One type can develop while a person is in a hospital. A different type, called community-acquired pneumonia, develops in people who are not, or have not recently been, in the hospital or other health care facility.  CAUSES Pneumonia may be caused by bacteria, viruses, or funguses. Community-acquired pneumonia is often caused by Streptococcus pneumonia bacteria. These bacteria are often passed from one person to another by breathing in droplets from the cough or sneeze of an infected person. RISK FACTORS The condition is more likely to develop in:  People who havechronic diseases, such as chronic obstructive pulmonary disease (COPD), asthma, congestive heart failure, cystic fibrosis, diabetes, or kidney disease.  People who haveearly-stage or late-stage HIV.  People who havesickle cell disease.  People who havehad their spleen removed (splenectomy).  People who havepoor Human resources officer.  People who havemedical conditions that increase the risk of breathing in (aspirating) secretions their own mouth and nose.   People who havea weakened immune system (immunocompromised).  People who smoke.  People whotravel to areas where pneumonia-causing germs commonly exist.  People whoare around animal habitats or animals that have pneumonia-causing germs, including birds, bats, rabbits, cats, and farm animals. SYMPTOMS Symptoms of this condition include:  Adry cough.  A wet (productive) cough.  Fever.  Sweating.  Chest pain, especially when breathing deeply or coughing.  Rapid breathing or difficulty breathing.  Shortness of breath.  Shaking  chills.  Fatigue.  Muscle aches. DIAGNOSIS Your health care provider will take a medical history and perform a physical exam. You may also have other tests, including:  Imaging studies of your chest, including X-rays.  Tests to check your blood oxygen level and other blood gases.  Other tests on blood, mucus (sputum), fluid around your lungs (pleural fluid), and urine. If your pneumonia is severe, other tests may be done to identify the specific cause of your illness. TREATMENT The type of treatment that you receive depends on many factors, such as the cause of your pneumonia, the medicines you take, and other medical conditions that you have. For most adults, treatment and recovery from pneumonia may occur at home. In some cases, treatment must happen in a hospital. Treatment may include:  Antibiotic medicines, if the pneumonia was caused by bacteria.  Antiviral medicines, if the pneumonia was caused by a virus.  Medicines that are given by mouth or through an IV tube.  Oxygen.  Respiratory therapy. Although rare, treating severe pneumonia may include:  Mechanical ventilation. This is done if you are not breathing well on your own and you cannot maintain a safe blood oxygen level.  Thoracentesis. This procedureremoves fluid around one lung or both lungs to help you breathe better. HOME CARE INSTRUCTIONS  Take over-the-counter and prescription medicines only as told by your health care provider.  Only takecough medicine if you are losing sleep. Understand that cough medicine can prevent your body's natural ability to remove mucus from your lungs.  If you were prescribed an antibiotic medicine, take it as told by your health care provider. Do not stop taking the antibiotic even if you start to feel better.  Sleep in a semi-upright position at night. Try sleeping in  a reclining chair, or place a few pillows under your head.  Do not use tobacco products, including cigarettes,  chewing tobacco, and e-cigarettes. If you need help quitting, ask your health care provider.  Drink enough water to keep your urine clear or pale yellow. This will help to thin out mucus secretions in your lungs. PREVENTION There are ways that you can decrease your risk of developing community-acquired pneumonia. Consider getting a pneumococcal vaccine if:  You are older than 47 years of age.  You are older than 47 years of age and are undergoing cancer treatment, have chronic lung disease, or have other medical conditions that affect your immune system. Ask your health care provider if this applies to you. There are different types and schedules of pneumococcal vaccines. Ask your health care provider which vaccination option is best for you. You may also prevent community-acquired pneumonia if you take these actions:  Get an influenza vaccine every year. Ask your health care provider which type of influenza vaccine is best for you.  Go to the dentist on a regular basis.  Wash your hands often. Use hand sanitizer if soap and water are not available. SEEK MEDICAL CARE IF:  You have a fever.  You are losing sleep because you cannot control your cough with cough medicine. SEEK IMMEDIATE MEDICAL CARE IF:  You have worsening shortness of breath.  You have increased chest pain.  Your sickness becomes worse, especially if you are an older adult or have a weakened immune system.  You cough up blood.   This information is not intended to replace advice given to you by your health care provider. Make sure you discuss any questions you have with your health care provider.   Document Released: 01/27/2005 Document Revised: 10/18/2014 Document Reviewed: 05/24/2014 Elsevier Interactive Patient Education [employer].

## 2015-04-25 NOTE — ED Provider Notes (Signed)
By signing my name below, I, Forrestine Him, attest that this documentation has been prepared under the direction and in the presence of [place], DO.  Electronically Signed: Forrestine Him, ED Scribe. 04/25/2015. 3:36 AM.   TIME SEEN: 3:31 AM   CHIEF COMPLAINT:  Chief Complaint  Patient presents with  . 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 female with a PMHx of a mental disorder-possible mental retardation who presents to the Emergency Department complaining of constant, ongoing, worsening productive cough x 2 weeks. No aggravating or alleviating factors reported. Family also reports loss of appetite and fatigue. OTC medications attempted at home without any improvement. No recent fever or chills. Pt was evaluated by PCP on 3/10 for same and advised to continue to attempt OTC medications. Family reports that they thought at that time it was a viral illness. Pt was then evaluated at Urgent Care as cough worsened earlier this afternoon. At that time pt had an CXR performed which revealed PNA. Pt was advised to come to the Emergency Department at time of discharge for further evaluation. No known allergies to medications. Family reports she did have pneumonia in June 2016.  PCP: Elyn Peers, MD    ROS: LEVEL 5 CAVEAT DUE TO MENTAL DISABILITY  PAST MEDICAL HISTORY/PAST SURGICAL HISTORY:  Past Medical History  Diagnosis Date  . Pneumonia   . Fibroids   . Mental disorder     MEDICATIONS:  Prior to Admission medications   Medication Sig Start Date End Date Taking? Authorizing Provider  albuterol (PROVENTIL HFA;VENTOLIN HFA) 108 (90 BASE) MCG/ACT inhaler Inhale 2 puffs into the lungs every 6 (six) hours as needed for wheezing or shortness of breath. 07/14/14   Rachelle A Denney, CNM  amoxicillin (AMOXIL) 500 MG capsule Take 1 capsule (500 mg total) by mouth 3 (three) times daily. 01/23/14   Harden Mo, MD   Dextromethorphan-Guaifenesin 20-400 MG TABS Take 2 tablets by mouth every 6 (six) hours as needed. 07/14/14   Rachelle A Denney, CNM  levofloxacin (LEVAQUIN) 500 MG tablet Take 1 tablet (500 mg total) by mouth daily. 07/12/14   Gregor Hams, MD  metroNIDAZOLE (FLAGYL) 500 MG tablet Take 1 tablet (500 mg total) by mouth 2 (two) times daily. 07/21/14   Rachelle A Denney, CNM  Multiple Vitamin (MULTIVITAMIN) capsule Take 1 capsule by mouth daily.    Historical Provider, MD  Prenat-FeCbn-FeAsp-Meth-FA-DHA (PRENATE MINI) 18-0.6-0.4-350 MG CAPS Take 1 capsule by mouth daily. 07/14/14   Rachelle A Denney, CNM  traMADol (ULTRAM) 50 MG tablet Take 1 tablet (50 mg total) by mouth at bedtime as needed (cough). 07/12/14   Gregor Hams, MD    ALLERGIES:  No Known Allergies  SOCIAL HISTORY:  Social History  Substance Use Topics  . Smoking status: Never Smoker   . Smokeless tobacco: Not on file  . Alcohol Use: No    FAMILY HISTORY: No family history on file.  EXAM: BP 126/86 mmHg  Pulse 80  Temp(Src) 98.1 F (36.7 C) (Oral)  Resp 21  Ht 5\' 2"  (1.575 m)  Wt 100 lb 2 oz (45.416 kg)  BMI 18.31 kg/m2  SpO2 98% CONSTITUTIONAL: Alert but does not answer questions- history is provided by aunt; Well-appearing, well-nourished, Afebrile and nontoxic appearing, in no distress HEAD: Normocephalic EYES: Conjunctivae clear, PERRL ENT: normal nose; no rhinorrhea; moist mucous membranes NECK: Supple, no meningismus, no  LAD  CARD: RRR; 2/6 systolic murmur noted; S1 and S2 appreciated; no clicks, no rubs, no gallops RESP: Normal chest excursion without splinting or tachypnea; breath sounds clear and equal bilaterally; no wheezes, no rhonchi, no rales, no hypoxia or respiratory distress, speaking full sentences, no increased work of breathing ABD/GI: Normal bowel sounds; non-distended; soft, non-tender, no rebound, no guarding, no peritoneal signs BACK:  The back appears normal and is non-tender to palpation, there  is no CVA tenderness EXT: Normal ROM in all joints; non-tender to palpation; no edema; normal capillary refill; no cyanosis, no calf tenderness or swelling    SKIN: Normal color for age and race; warm; no rash NEURO: Moves all extremities equally, sensation to light touch intact diffusely, cranial nerves II through XII intact PSYCH: The patient's mood and manner are appropriate. Grooming and personal hygiene are appropriate.  MEDICAL DECISION MAKING: Patient here with community-acquired pneumonia. Labs unremarkable. Hemodynamically stable. She has no increased work of breathing, hypoxia. Her lungs are clear. She is very well-appearing today. She is not immunocompromised, diabetic. No recent admission to the hospital to suggest a healthcare associated pneumonia. I feel she can be successfully treated as an outpatient. Will discharge on Levaquin with Tessalon Perles. Have discussed this with patient's guardian who is her aunt who feels comfortable with this plan. Dr. Criss Rosales is her primary care physician. I recommend close follow-up. Discussed at length customary and normal return precautions.  Family verbalize understanding and is comfortable with this plan.    I personally performed the services described in this documentation, which was scribed in my presence. The recorded information has been reviewed and is accurate.   [place], DO 04/25/15 [phone]

## 2016-01-01 ENCOUNTER — Ambulatory Visit: Payer: Medicaid Other | Admitting: Certified Nurse Midwife

## 2016-01-29 ENCOUNTER — Ambulatory Visit: Payer: Medicaid Other | Admitting: Certified Nurse Midwife

## 2016-02-27 ENCOUNTER — Ambulatory Visit: Payer: Medicaid Other | Admitting: Obstetrics and Gynecology

## 2016-04-02 ENCOUNTER — Ambulatory Visit (INDEPENDENT_AMBULATORY_CARE_PROVIDER_SITE_OTHER): Payer: Medicaid Other | Admitting: Certified Nurse Midwife

## 2016-04-02 ENCOUNTER — Other Ambulatory Visit: Payer: Self-pay | Admitting: Certified Nurse Midwife

## 2016-04-02 ENCOUNTER — Other Ambulatory Visit (HOSPITAL_COMMUNITY)
Admission: RE | Admit: 2016-04-02 | Discharge: 2016-04-02 | Disposition: A | Discharge status: Home / Self Care | Payer: Medicaid Other | Source: Ambulatory Visit | Attending: Obstetrics | Admitting: Obstetrics

## 2016-04-02 ENCOUNTER — Encounter: Payer: Self-pay | Admitting: Obstetrics

## 2016-04-02 VITALS — BP 131/83 | HR 76 | Wt 110.2 lb

## 2016-04-02 DIAGNOSIS — Z0000 Encounter for general adult medical examination without abnormal findings: Secondary | ICD-10-CM | POA: Diagnosis not present

## 2016-04-02 DIAGNOSIS — Z86018 Personal history of other benign neoplasm: Secondary | ICD-10-CM

## 2016-04-02 DIAGNOSIS — Z01419 Encounter for gynecological examination (general) (routine) without abnormal findings: Secondary | ICD-10-CM | POA: Diagnosis not present

## 2016-04-02 DIAGNOSIS — N951 Menopausal and female climacteric states: Secondary | ICD-10-CM

## 2016-04-02 DIAGNOSIS — N6310 Unspecified lump in the right breast, unspecified quadrant: Secondary | ICD-10-CM

## 2016-04-02 DIAGNOSIS — Z23 Encounter for immunization: Secondary | ICD-10-CM

## 2016-04-02 DIAGNOSIS — Z113 Encounter for screening for infections with a predominantly sexual mode of transmission: Secondary | ICD-10-CM | POA: Insufficient documentation

## 2016-04-02 DIAGNOSIS — Z1151 Encounter for screening for human papillomavirus (HPV): Secondary | ICD-10-CM | POA: Diagnosis not present

## 2016-04-02 NOTE — Progress Notes (Signed)
Subjective:     Patient ID: [name], female   DOB: 1970-05-04, 46 y.o.   MRN: [id number]  45 yobf with mother presented for routine gyn wellness exam, pt has h/o mentally delayed, but verbal and at baseline with communication today, pt and mother stated that over the last year she has been getting spotty menstrual cycle, pt has one menstrual cycle each month that last 4 day in duration, uses pads, but at times will skip a month, pt c/o moderate cramps to which she uses 1 pill of otc motrin which relief her pain. Pt denies taking any meds currently, is followed by cards for irregular heart beat and miurmur at times, currently pt denies n, v, d, rashes, fever, cp or sob, no dysuria, vaginal discharge or foul smells, no vaginal irritation or itching, pts mother stated pt has never been sexually active in life. Pt mother stated she was worried about her fibroids.    Gynecologic Exam  The patient's primary symptoms include missed menses. The patient's pertinent negatives include no genital itching, genital lesions, genital odor, genital rash, vaginal bleeding or vaginal discharge. This is a new problem. The current episode started more than 1 month ago. The problem occurs intermittently. The problem has been waxing and waning. The pain is moderate. She is not pregnant. Pertinent negatives include no abdominal pain, anorexia, back pain, chills, constipation, diarrhea, discolored urine, dysuria, fever, flank pain, frequency, hematuria, nausea, rash, urgency or vomiting. Nothing aggravates the symptoms. She has tried NSAIDs for the symptoms. The treatment provided significant relief. She is not sexually active. She uses nothing for contraception. Her menstrual history has been regular.     Review of Systems  Constitutional: Negative.  Negative for chills and fever.  HENT: Negative.   Eyes: Negative.   Respiratory: Negative.   Cardiovascular: Negative.  Negative for palpitations.  Gastrointestinal:  Negative.  Negative for abdominal pain, anorexia, constipation, diarrhea, nausea and vomiting.  Endocrine: Negative.   Genitourinary: Positive for missed menses. Negative for dysuria, flank pain, frequency, hematuria, urgency and vaginal discharge.  Musculoskeletal: Negative for back pain.  Skin: Negative for rash.  Neurological: Negative.   Hematological: Negative.   Psychiatric/Behavioral: Negative.        Objective:   Physical Exam  Constitutional: She is oriented to person, place, and time. She appears well-developed. No distress.  HENT:  Head: Normocephalic and atraumatic.  Nose: Nose normal.  Mouth/Throat: Oropharynx is clear and moist.  Eyes: Conjunctivae and EOM are normal. Pupils are equal, round, and reactive to light. Right eye exhibits no discharge. Left eye exhibits no discharge. No scleral icterus.  Neck: Normal range of motion. Neck supple. No JVD present. No tracheal deviation present. No thyromegaly present.  Cardiovascular: An irregular rhythm present.  Murmur heard. Pulmonary/Chest: Effort normal and breath sounds normal. No accessory muscle usage or stridor. No respiratory distress. Chest wall is not dull to percussion. She exhibits no tenderness, no laceration, no edema, no deformity, no swelling and no retraction. Right breast exhibits mass. Right breast exhibits no inverted nipple.    Abdominal: Soft. Normal appearance, normal aorta and bowel sounds are normal. There is no hepatosplenomegaly, splenomegaly or hepatomegaly. There is no tenderness. There is no CVA tenderness. No hernia.  Genitourinary: Rectum normal.    No breast swelling, tenderness, discharge or bleeding. Pelvic exam was performed with patient supine. No labial fusion. There is no rash, tenderness, lesion or injury on the right labia. There is no rash, tenderness, lesion or injury  on the left labia. Uterus is enlarged. Uterus is not deviated, not fixed and not tender. Cervix exhibits discharge.  Cervix exhibits no motion tenderness and no friability. Right adnexum displays no mass, no tenderness and no fullness. Left adnexum displays mass and tenderness. Left adnexum displays no fullness. There is erythema in the vagina. No tenderness or bleeding in the vagina. No foreign body in the vagina. No signs of injury around the vagina. Vaginal discharge found.    Musculoskeletal: Normal range of motion.  Lymphadenopathy:    She has no cervical adenopathy.    She has no axillary adenopathy.       Right: No inguinal adenopathy present.       Left: No inguinal adenopathy present.  Neurological: She is oriented to person, place, and time. She has normal strength. No cranial nerve deficit or sensory deficit.  Skin: Skin is warm, dry and intact. She is not diaphoretic. No cyanosis. Nails show no clubbing.  Psychiatric: She has a normal mood and affect. Her speech is normal and behavior is normal. Judgment and thought content normal. Cognition and memory are normal.  Pt at baseline for cognitive status.        Assessment:     1. Healthy female exam 2. Pneumonia healed, no SOB, cp noted  3. Pt will continue to f/u with cardiology about cardiac murmur, and irregular heart beat 4. Continue to monitor uterine fibroids 5. Right breast density 9oclock, axilla region.  6. Go to mammogram appointment on 3/?/2018     Plan:     1. Healthy female exam= will continue to schedule yearly exams.  2. Pneumonia healed, no SOB, cp noted + continue daily deep breathing to keep lungs open, will receive flu vaccine today, if sob or cp go to the ER.  3. Pt will continue to f/u with caridology aboutcardiac murmur, and irrgular heart beat= if cp go to the ER 4. Continue to monitor urterine fibroids=ultrasound ordered for uterine fibroid monitoring, none on file.  5. Right breast density 9oclock, axilla region. = ultrasound ordered for diagnositicc for breast density, due to [place] of BRCA. Continue SBE monthly after  menses.  6. Go to mammogram appoitment on 3/?/2018=continue every year.      F/u in one year for annual exam, or PRN, we will call you with lab result if treatment if required.

## 2016-04-02 NOTE — Progress Notes (Signed)
Patient is in the office for annual exam. Patient reports she has started skipping cycles.

## 2016-04-02 NOTE — Patient Instructions (Signed)
Menopause Menopause is the normal time of life when menstrual periods stop completely. Menopause is complete when you have missed 12 consecutive menstrual periods. It usually occurs between the ages of 63 years and 78 years. Very rarely does a woman develop menopause before the age of 74 years. At menopause, your ovaries stop producing the female hormones estrogen and progesterone. This can cause undesirable symptoms and also affect your health. Sometimes the symptoms may occur 4-5 years before the menopause begins. There is no relationship between menopause and:  Oral contraceptives.  Number of children you had.  Race.  The age your menstrual periods started (menarche). Heavy smokers and very thin women may develop menopause earlier in life. What are the causes?  The ovaries stop producing the female hormones estrogen and progesterone. Other causes include:  Surgery to remove both ovaries.  The ovaries stop functioning for no known reason.  Tumors of the pituitary gland in the brain.  Medical disease that affects the ovaries and hormone production.  Radiation treatment to the abdomen or pelvis.  Chemotherapy that affects the ovaries. What are the signs or symptoms?  Hot flashes.  Night sweats.  Decrease in sex drive.  Vaginal dryness and thinning of the vagina causing painful intercourse.  Dryness of the skin and developing wrinkles.  Headaches.  Tiredness.  Irritability.  Memory problems.  Weight gain.  Bladder infections.  Hair growth of the face and chest.  Infertility. More serious symptoms include:  Loss of bone (osteoporosis) causing breaks (fractures).  Depression.  Hardening and narrowing of the arteries (atherosclerosis) causing heart attacks and strokes. How is this diagnosed?  When the menstrual periods have stopped for 12 straight months.  Physical exam.  Hormone studies of the blood. How is this treated? There are many treatment  choices and nearly as many questions about them. The decisions to treat or not to treat menopausal changes is an individual choice made with your health care provider. Your health care provider can discuss the treatments with you. Together, you can decide which treatment will work best for you. Your treatment choices may include:  Hormone therapy (estrogen and progesterone).  Non-hormonal medicines.  Treating the individual symptoms with medicine (for example antidepressants for depression).  Herbal medicines that may help specific symptoms.  Counseling by a psychiatrist or psychologist.  Group therapy.  Lifestyle changes including:  Eating healthy.  Regular exercise.  Limiting caffeine and alcohol.  Stress management and meditation.  No treatment. Follow these instructions at home:  Take the medicine your health care provider gives you as directed.  Get plenty of sleep and rest.  Exercise regularly.  Eat a diet that contains calcium (good for the bones) and soy products (acts like estrogen hormone).  Avoid alcoholic beverages.  Do not smoke.  If you have hot flashes, dress in layers.  Take supplements, calcium, and vitamin D to strengthen bones.  You can use over-the-counter lubricants or moisturizers for vaginal dryness.  Group therapy is sometimes very helpful.  Acupuncture may be helpful in some cases. Contact a health care provider if:  You are not sure you are in menopause.  You are having menopausal symptoms and need advice and treatment.  You are still having menstrual periods after age 32 years.  You have pain with intercourse.  Menopause is complete (no menstrual period for 12 months) and you develop vaginal bleeding.  You need a referral to a specialist (gynecologist, psychiatrist, or psychologist) for treatment. Get help right away if:  You  have severe depression.  You have excessive vaginal bleeding.  You fell and think you have a broken  bone.  You have pain when you urinate.  You develop leg or chest pain.  You have a fast pounding heart beat (palpitations).  You have severe headaches.  You develop vision problems.  You feel a lump in your breast.  You have abdominal pain or severe indigestion. This information is not intended to replace advice given to you by your health care provider. Make sure you discuss any questions you have with your health care provider. Document Released: 04/19/2003 Document Revised: 07/05/2015 Document Reviewed: 08/26/2012 Elsevier Interactive Patient Education  2017 [employer]. Uterine Fibroids Uterine fibroids are tissue masses (tumors). They are also called leiomyomas. They can develop inside of a woman's womb (uterus). They can grow very large. Fibroids are not cancerous (benign). Most fibroids do not require medical treatment. Follow these instructions at home:  Keep all follow-up visits as told by your doctor. This is important.  Take medicines only as told by your doctor.  If you were prescribed a hormone treatment, take the hormone medicines exactly as told.  Do not take aspirin. It can cause bleeding.  Ask your doctor about taking iron pills and increasing the amount of dark green, leafy vegetables in your diet. These actions can help to boost your blood iron levels.  Pay close attention to your period. Tell your doctor about any changes, such as:  Increased blood flow. This may require you to use more pads or tampons than usual per month.  A change in the number of days that your period lasts per month.  A change in symptoms that come with your period, such as back pain or cramping in your belly area (abdomen). Contact a doctor if:  You have pain in your back or the area between your hip bones (pelvic area) that is not controlled by medicines.  You have pain in your abdomen that is not controlled with medicines.  You have an increase in bleeding between and during  periods.  You soak tampons or pads in a half hour or less.  You feel lightheaded.  You feel extra tired.  You feel weak. Get help right away if:  You pass out (faint).  You have a sudden increase in pelvic pain. This information is not intended to replace advice given to you by your health care provider. Make sure you discuss any questions you have with your health care provider. Document Released: 03/01/2010 Document Revised: 09/28/2015 Document Reviewed: 07/26/2013 Elsevier Interactive Patient Education  2017 [employer].

## 2016-04-03 LAB — HDL CHOLESTEROL: HDL: 70 mg/dL (ref >39)

## 2016-04-03 LAB — CBC WITH DIFFERENTIAL/PLATELET
Basophils Absolute: 0 10*3/uL (ref 0.0–0.2)
Basos: 1 %
EOS (ABSOLUTE): 0.1 10*3/uL (ref 0.0–0.4)
Eos: 1 %
Hematocrit: 34.2 % (ref 34.0–46.6)
Hemoglobin: 10.1 g/dL — ABNORMAL LOW (ref 11.1–15.9)
IMMATURE GRANULOCYTES: 0 %
Immature Grans (Abs): 0 10*3/uL (ref 0.0–0.1)
LYMPHS ABS: 1.5 10*3/uL (ref 0.7–3.1)
Lymphs: 32 %
MCH: 22.5 pg — ABNORMAL LOW (ref 26.6–33.0)
MCHC: 29.5 g/dL — AB (ref 31.5–35.7)
MCV: 76 fL — ABNORMAL LOW (ref 79–97)
MONOS ABS: 0.6 10*3/uL (ref 0.1–0.9)
Monocytes: 13 %
NEUTROS PCT: 53 %
Neutrophils Absolute: 2.5 10*3/uL (ref 1.4–7.0)
PLATELETS: 278 10*3/uL (ref 150–379)
RBC: 4.48 x10E6/uL (ref 3.77–5.28)
RDW: 18.6 % — AB (ref 12.3–15.4)
WBC: 4.6 10*3/uL (ref 3.4–10.8)

## 2016-04-03 LAB — COMPREHENSIVE METABOLIC PANEL
A/G RATIO: 1.5 (ref 1.2–2.2)
ALK PHOS: 51 IU/L (ref 39–117)
ALT: 19 IU/L (ref 0–32)
AST: 34 IU/L (ref 0–40)
Albumin: 4.2 g/dL (ref 3.5–5.5)
BUN/Creatinine Ratio: 22 (ref 9–23)
BUN: 11 mg/dL (ref 6–24)
Bilirubin Total: 0.3 mg/dL (ref 0.0–1.2)
CALCIUM: 8.8 mg/dL (ref 8.7–10.2)
CO2: 24 mmol/L (ref 18–29)
CREATININE: 0.49 mg/dL — AB (ref 0.57–1.00)
Chloride: 103 mmol/L (ref 96–106)
GFR calc Af Amer: 134 (ref >59)
GFR, EST NON AFRICAN AMERICAN: 116 (ref >59)
GLOBULIN, TOTAL: 2.8 (ref 1.5–4.5)
Glucose: 91 mg/dL (ref 65–99)
POTASSIUM: 4.3 mmol/L (ref 3.5–5.2)
SODIUM: 144 mmol/L (ref 134–144)
Total Protein: 7 g/dL (ref 6.0–8.5)

## 2016-04-03 LAB — HEPATITIS B SURFACE ANTIGEN: HEP B S AG: NEGATIVE

## 2016-04-03 LAB — TRIGLYCERIDES: TRIGLYCERIDES: 100 mg/dL (ref 0–149)

## 2016-04-03 LAB — HEPATITIS C ANTIBODY

## 2016-04-03 LAB — CERVICOVAGINAL ANCILLARY ONLY
Bacterial vaginitis: NEGATIVE
CANDIDA VAGINITIS: NEGATIVE

## 2016-04-03 LAB — CHOLESTEROL, TOTAL: CHOLESTEROL TOTAL: 189 mg/dL (ref 100–199)

## 2016-04-03 LAB — RPR: RPR: NONREACTIVE

## 2016-04-03 LAB — TSH: TSH: 1.93 u[IU]/mL (ref 0.450–4.500)

## 2016-04-03 LAB — HIV ANTIBODY (ROUTINE TESTING W REFLEX): HIV SCREEN 4TH GENERATION: NONREACTIVE

## 2016-04-04 ENCOUNTER — Other Ambulatory Visit: Payer: Self-pay | Admitting: Certified Nurse Midwife

## 2016-04-04 LAB — CYTOLOGY - PAP
DIAGNOSIS: NEGATIVE
HPV: NOT DETECTED

## 2016-04-16 ENCOUNTER — Ambulatory Visit
Admission: RE | Admit: 2016-04-16 | Discharge: 2016-04-16 | Disposition: A | Discharge status: Home / Self Care | Payer: Medicaid Other | Source: Ambulatory Visit | Attending: Certified Nurse Midwife | Admitting: Certified Nurse Midwife

## 2016-04-16 ENCOUNTER — Ambulatory Visit (HOSPITAL_COMMUNITY)
Admission: RE | Admit: 2016-04-16 | Discharge: 2016-04-16 | Disposition: A | Discharge status: Home / Self Care | Payer: Medicaid Other | Source: Ambulatory Visit | Attending: Certified Nurse Midwife | Admitting: Certified Nurse Midwife

## 2016-04-16 DIAGNOSIS — D259 Leiomyoma of uterus, unspecified: Secondary | ICD-10-CM | POA: Diagnosis not present

## 2016-04-16 DIAGNOSIS — N926 Irregular menstruation, unspecified: Secondary | ICD-10-CM | POA: Diagnosis not present

## 2016-04-16 DIAGNOSIS — Z86018 Personal history of other benign neoplasm: Secondary | ICD-10-CM

## 2016-04-16 DIAGNOSIS — N6310 Unspecified lump in the right breast, unspecified quadrant: Secondary | ICD-10-CM

## 2016-04-16 DIAGNOSIS — N951 Menopausal and female climacteric states: Secondary | ICD-10-CM

## 2016-04-17 ENCOUNTER — Other Ambulatory Visit: Payer: Self-pay | Admitting: Certified Nurse Midwife

## 2016-04-21 ENCOUNTER — Telehealth: Payer: Self-pay

## 2016-04-21 DIAGNOSIS — N838 Other noninflammatory disorders of ovary, fallopian tube and broad ligament: Secondary | ICD-10-CM

## 2016-04-21 NOTE — Telephone Encounter (Signed)
Spoke with patient and advised of need of lab work and biopsy. Patient agreed, transferred to scheduler.

## 2016-04-29 ENCOUNTER — Ambulatory Visit (INDEPENDENT_AMBULATORY_CARE_PROVIDER_SITE_OTHER): Payer: Medicaid Other | Admitting: Certified Nurse Midwife

## 2016-04-29 ENCOUNTER — Telehealth: Payer: Self-pay

## 2016-04-29 ENCOUNTER — Encounter: Payer: Self-pay | Admitting: Certified Nurse Midwife

## 2016-04-29 ENCOUNTER — Encounter: Payer: Self-pay | Admitting: *Deleted

## 2016-04-29 VITALS — BP 121/80 | HR 86 | Wt 107.6 lb

## 2016-04-29 DIAGNOSIS — N83201 Unspecified ovarian cyst, right side: Secondary | ICD-10-CM

## 2016-04-29 DIAGNOSIS — N838 Other noninflammatory disorders of ovary, fallopian tube and broad ligament: Secondary | ICD-10-CM

## 2016-04-29 DIAGNOSIS — N839 Noninflammatory disorder of ovary, fallopian tube and broad ligament, unspecified: Secondary | ICD-10-CM | POA: Diagnosis not present

## 2016-04-29 DIAGNOSIS — R938 Abnormal findings on diagnostic imaging of other specified body structures: Secondary | ICD-10-CM | POA: Diagnosis not present

## 2016-04-29 DIAGNOSIS — R9389 Abnormal findings on diagnostic imaging of other specified body structures: Secondary | ICD-10-CM

## 2016-04-29 NOTE — Progress Notes (Signed)
Patient ID: [name], female   DOB: [birth year], 46 y.o.   MRN: [id number]  Chief Complaint  Patient presents with  . GYN    Patient is in the office for GYN visit.    HPI [name] is a 46 y.o. female.  Here for endometrial biopsy due to [place] results of thickened endometrium.  LMP around the start of the month according to her sister whom is her care taker.    HPI  Past Medical History:  Diagnosis Date  . Fibroids   . Mental disorder   . Pneumonia     Past Surgical History:  Procedure Laterality Date  . thumb surgery      Family History  Problem Relation Age of Onset  . Breast cancer Mother     Social History Social History  Substance Use Topics  . Smoking status: Never Smoker  . Smokeless tobacco: Never Used  . Alcohol use No    No Known Allergies  Current Outpatient Prescriptions  Medication Sig Dispense Refill  . Multiple Vitamin (MULTIVITAMIN) capsule Take 1 capsule by mouth daily.    . Prenat-FeCbn-FeAsp-Meth-FA-DHA (PRENATE MINI) 18-0.6-0.4-350 MG CAPS Take 1 capsule by mouth daily. (Patient not taking: Reported on 04/29/2016) 30 capsule 12   No current facility-administered medications for this visit.     Review of Systems Review of Systems Constitutional: negative for fatigue and weight loss Respiratory: negative for cough and wheezing Cardiovascular: negative for chest pain, fatigue and palpitations Gastrointestinal: negative for abdominal pain and change in bowel habits Genitourinary:negative Integument/breast: negative for nipple discharge Musculoskeletal:negative for myalgias Neurological: negative for gait problems and tremors Behavioral/Psych: negative for abusive relationship, depression Endocrine: negative for temperature intolerance      Blood pressure 121/80, pulse 86, weight 107 lb 9.6 oz (48.8 kg). CLINICAL DATA:  Perimenopausal symptoms. History of fibroid. Irregular periods.  EXAM: TRANSABDOMINAL AND TRANSVAGINAL ULTRASOUND  OF PELVIS  TECHNIQUE: Both transabdominal and transvaginal ultrasound examinations of the pelvis were performed. Transabdominal technique was performed for global imaging of the pelvis including uterus, ovaries, adnexal regions, and pelvic cul-de-sac. It was necessary to proceed with endovaginal exam following the transabdominal exam to visualize the ovaries and adnexa.  COMPARISON:  None  FINDINGS: Uterus  Measurements: 11.7 x 6.6 x 011.2 cm. Multiple fibroids, the largest in the posterior right body measuring up to 6.8 cm  Endometrium  Thickness: Thickened, measuring 20 mm. 2.2 x 2.2 x 1.9 cm hyperechoic area noted within the endometrium, only seen on transabdominal imaging  Right ovary  Measurements: 7.0 x 3.9 x 4.9 cm. 6.0 x 4.8 x 3.6 cm cyst within the right ovary which appears benign. There may be a thin peripheral septation. No internal blood flow.  Left ovary  Measurements: 3.9 x 2.3 x 2.6 cm. Normal appearance/no adnexal mass.  Other findings  Small amount of free fluid.  IMPRESSION: Multiple uterine fibroids, the largest measuring up to 6.8 cm.  Endometrium is thickened at 20 mm with focal hyperechoic area measured up to 2.2 cm. This could reflect pedunculated submucosal fibroid, polyp or other endometrial mass. Consider further evaluation with sonohysterogram or hysteroscopy.   Electronically Signed   By: Rolm Baptise M.D.   On: 04/16/2016 10:44  Physical Exam Physical Exam General:   alert  Skin:   no rash or abnormalities  Lungs:   clear to auscultation bilaterally  Heart:   regular rate and rhythm, S1, S2 normal, no murmur, click, rub or gallop  Breasts:   deferred  Abdomen:  normal findings: no organomegaly, soft, non-tender and no hernia  Pelvis:  External genitalia: normal general appearance Urinary system: urethral meatus normal and bladder without fullness, nontender Vaginal: normal without tenderness, induration or  masses Cervix: normal appearance, Constricted OS, unable to pass small dilator through.  Endometrial biopsy attempt abandoned.      75% of 30 min visit spent on counseling and coordination of care.    Data Reviewed Previous medical records, [place] results, labs  Assessment     Right ovarian mass Thickened endometrium Multiple fibroids/polyp ?stenotic cervix Failed endometrial biopsy attempt    Plan   Call placed to Dr. Denman George for chart review/consult if oncology interventions are indicated.   Orders Placed This Encounter  Procedures  . CA 125   No orders of the defined types were placed in this encounter.   Possible management options include:another attempt at endometrial biopsy with flexeril, Cytotec and motrin along with being on her period at the time.      Depending on oncology recommendations she may need surgical consult for hysterectomy.

## 2016-04-29 NOTE — Telephone Encounter (Signed)
Kandis Cocking, CNM called to discuss Manju's case with Dr Denman George.  RN offered Kandis Cocking, CNM, Dr Serita Grit cell number. Ernst Bowler stated that Dr Denman George could call her when she is in the office at [phone]. RN will give this message to Dr Denman George.

## 2016-04-30 LAB — CA 125: CA 125: 24.4 U/mL (ref 0.0–38.1)

## 2016-05-05 ENCOUNTER — Other Ambulatory Visit: Payer: Self-pay | Admitting: Certified Nurse Midwife

## 2016-05-12 ENCOUNTER — Other Ambulatory Visit: Payer: Self-pay | Admitting: Certified Nurse Midwife

## 2016-05-12 DIAGNOSIS — N83201 Unspecified ovarian cyst, right side: Secondary | ICD-10-CM

## 2016-05-12 DIAGNOSIS — R9389 Abnormal findings on diagnostic imaging of other specified body structures: Secondary | ICD-10-CM

## 2016-05-22 ENCOUNTER — Other Ambulatory Visit: Payer: Self-pay | Admitting: Certified Nurse Midwife

## 2016-05-22 DIAGNOSIS — R9389 Abnormal findings on diagnostic imaging of other specified body structures: Secondary | ICD-10-CM

## 2016-05-22 DIAGNOSIS — N838 Other noninflammatory disorders of ovary, fallopian tube and broad ligament: Secondary | ICD-10-CM

## 2016-05-22 DIAGNOSIS — N939 Abnormal uterine and vaginal bleeding, unspecified: Secondary | ICD-10-CM

## 2016-05-28 ENCOUNTER — Other Ambulatory Visit: Payer: Self-pay | Admitting: Certified Nurse Midwife

## 2016-05-28 DIAGNOSIS — R9389 Abnormal findings on diagnostic imaging of other specified body structures: Secondary | ICD-10-CM

## 2016-05-28 DIAGNOSIS — N83201 Unspecified ovarian cyst, right side: Secondary | ICD-10-CM

## 2016-05-28 DIAGNOSIS — F79 Unspecified intellectual disabilities: Secondary | ICD-10-CM

## 2016-05-29 ENCOUNTER — Encounter: Payer: Self-pay | Admitting: Certified Nurse Midwife

## 2016-06-11 ENCOUNTER — Other Ambulatory Visit: Payer: Self-pay | Admitting: Certified Nurse Midwife

## 2016-07-31 ENCOUNTER — Telehealth: Payer: Self-pay | Admitting: *Deleted

## 2016-07-31 NOTE — Telephone Encounter (Signed)
Contacted the patient's mother and scheduled the new patient appt. Appt scheduled for July 2nd at 10:45; arrive at 10:30

## 2016-08-11 ENCOUNTER — Ambulatory Visit: Payer: Medicaid Other | Admitting: Gynecologic Oncology

## 2016-08-12 ENCOUNTER — Telehealth: Payer: Self-pay | Admitting: *Deleted

## 2016-08-12 ENCOUNTER — Other Ambulatory Visit: Payer: Self-pay | Admitting: Certified Nurse Midwife

## 2016-08-12 NOTE — Telephone Encounter (Signed)
Contacted [name]'s office regarding the patient's ftka from July 2nd

## 2016-08-18 ENCOUNTER — Other Ambulatory Visit: Payer: Self-pay | Admitting: Certified Nurse Midwife

## 2016-08-19 ENCOUNTER — Telehealth: Payer: Self-pay | Admitting: *Deleted

## 2016-08-19 NOTE — Telephone Encounter (Signed)
Patient's mother called and rescheduled the missed appt. Appt scheduled for July 16th at 10:45am.

## 2016-08-21 ENCOUNTER — Telehealth: Payer: Self-pay

## 2016-08-21 ENCOUNTER — Encounter: Payer: Self-pay | Admitting: *Deleted

## 2016-08-21 NOTE — Telephone Encounter (Signed)
Mother calling to see if her daughter Ms Dykes needs to keep appointment with Dr. Denman George on 08-25-16 as she saw Dr. [place] Pink 08-20-16 and is set up for [facility] and Merina IUD placement. Told mother Ms Shon Baton that Dr. Denman George said that she did not need to she her and continue with plan of care with Dr. Zigmund Daniel.  Mother verbalized understanding.  &-16-18 appointment cancelled.

## 2016-08-25 ENCOUNTER — Ambulatory Visit: Payer: Medicaid Other | Admitting: Gynecologic Oncology

## 2016-10-09 ENCOUNTER — Other Ambulatory Visit: Payer: Self-pay | Admitting: Certified Nurse Midwife

## 2017-05-04 ENCOUNTER — Other Ambulatory Visit: Payer: Self-pay | Admitting: Family Medicine

## 2017-05-04 DIAGNOSIS — Z1231 Encounter for screening mammogram for malignant neoplasm of breast: Secondary | ICD-10-CM

## 2017-05-05 ENCOUNTER — Encounter: Payer: Self-pay | Admitting: Neurology

## 2017-05-05 ENCOUNTER — Ambulatory Visit: Payer: Medicaid Other | Admitting: Neurology

## 2017-05-05 VITALS — BP 126/86 | HR 77 | Ht 62.0 in | Wt 105.0 lb

## 2017-05-05 DIAGNOSIS — R011 Cardiac murmur, unspecified: Secondary | ICD-10-CM | POA: Diagnosis not present

## 2017-05-05 DIAGNOSIS — R6250 Unspecified lack of expected normal physiological development in childhood: Secondary | ICD-10-CM

## 2017-05-05 DIAGNOSIS — G7100 Muscular dystrophy, unspecified: Secondary | ICD-10-CM | POA: Diagnosis not present

## 2017-05-05 DIAGNOSIS — I499 Cardiac arrhythmia, unspecified: Secondary | ICD-10-CM | POA: Diagnosis not present

## 2017-05-05 DIAGNOSIS — R269 Unspecified abnormalities of gait and mobility: Secondary | ICD-10-CM

## 2017-05-05 DIAGNOSIS — R296 Repeated falls: Secondary | ICD-10-CM

## 2017-05-05 NOTE — Patient Instructions (Addendum)
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 I am not sure, what test we can do, that has not already been done in the past at [facility]. I am not able to review any old records. There is a family history of muscular dystrophy, you may have myotonic dystrophy, but I would recommend that you get plugged in with the [facility] clinic at either [facility] or [facility] or back to [facility]. Please let me know, what you decide. I would like to make a referral in that regard. In the meantime, we will do work up in the form of EMG and nerve conduction velocity test, which is an electrical nerve and muscle test, which we will schedule. We will call you with the results. I will also make a referral to physical therapy. You will likely need a walker.  I noticed an irregular heartbeat and you do have a heart murmur. Please make a follow up appointment with your cardiologist, Dr. Einar Gip.

## 2017-05-05 NOTE — Progress Notes (Signed)
Subjective:    Patient ID: GESSELLE FITZSIMONS is a 47 y.o. female.  HPI     Star Age, MD, PhD [facility] [address] [place], [place] [address]  Dear Dr. Doreene Nest,   I saw your patient, Chabeli Barsamian, upon your kind request in my neurologic clinic today for initial consultation of her gait disorder. The patient is accompanied by her mother (her biological aunt who raised her since infancy) today. As you know, Ms. Borden is a 47 year old woman with an underlying medical history of developmental delays since childhood, history of anemia, history of cataracts with status post surgery, who is reported to have worsening weakness and gait difficulties, recurrent falls per mother's report. Patient reports very little on her own but is able to speak in short sentences and understands very well. According to her mother, patient was born with difficulties and delays. She had workup and treatment and therapy through [facility] years ago. Patient's mother was told that she had some form of muscular dystrophy. There is a family history of muscular dystrophy in patient's maternal side, maternal great aunt and great uncle were affected by muscular dystrophy. Patient's grandmother was apparently tested genetically and tested negative for the disorder. Patient denies any difficulty swallowing or choking issues. She is trying to be careful when getting out of bed which is when she has fallen recently. She has a history of heart murmur and saw Dr. Einar Gip, maybe 2 years ago. I reviewed your office note from 03/20/2017. She does not use a walker. Patient does not smoke or drink alcohol and drinks limited caffeine in the form of coffee in the morning. Her mother is not exactly sure about the timeline and her delays she started walking around 32 or 47 years of age and had speech impairment. She needed therapies and AFOs. She has had EMG and nerve conduction testing in the past but unclear how  long ago. Unclear if she has had genetic testing or not.  Her Past Medical History Is Significant For: Past Medical History:  Diagnosis Date  . Fibroids   . Mental disorder   . Pneumonia     Her Past Surgical History Is Significant For: Past Surgical History:  Procedure Laterality Date  . thumb surgery      Her Family History Is Significant For: Family History  Problem Relation Age of Onset  . Breast cancer Mother     Her Social History Is Significant For: Social History   Socioeconomic History  . Marital status: Single    Spouse name: Not on file  . Number of children: Not on file  . Years of education: Not on file  . Highest education level: Not on file  Occupational History  . Not on file  Social Needs  . Financial resource strain: Not on file  . Food insecurity:    Worry: Not on file    Inability: Not on file  . Transportation needs:    Medical: Not on file    Non-medical: Not on file  Tobacco Use  . Smoking status: Never Smoker  . Smokeless tobacco: Never Used  Substance and Sexual Activity  . Alcohol use: No    Alcohol/week: 0.0 oz  . Drug use: No  . Sexual activity: Never    Birth control/protection: None  Lifestyle  . Physical activity:    Days per week: Not on file    Minutes per session: Not on file  . Stress: Not on  file  Relationships  . Social connections:    Talks on phone: Not on file    Gets together: Not on file    Attends religious service: Not on file    Active member of club or organization: Not on file    Attends meetings of clubs or organizations: Not on file    Relationship status: Not on file  Other Topics Concern  . Not on file  Social History Narrative  . Not on file    Her Allergies Are:  No Known Allergies:   Her Current Medications Are:  Outpatient Encounter Medications as of 05/05/2017  Medication Sig  . Multiple Vitamin (MULTIVITAMIN) capsule Take 1 capsule by mouth daily.  . [DISCONTINUED]  Prenat-FeCbn-FeAsp-Meth-FA-DHA (PRENATE MINI) 18-0.6-0.4-350 MG CAPS Take 1 capsule by mouth daily.   No facility-administered encounter medications on file as of 05/05/2017.   :   Review of Systems:  Out of a complete 14 point review of systems, all are reviewed and negative with the exception of these symptoms as listed below: Review of Systems  Neurological:       Patient's mother is concerned because she has episodes of tremors, leg weakness, falls. Patient has a family history of neurological problems.     Objective:  Neurological Exam  Physical Exam Physical Examination:   Vitals:   05/05/17 1003  BP: 126/86  Pulse: 77   General Examination: The patient is a very pleasant 47 y.o. female in no acute distress. She appears thin and frail, deconditioned. She is minimally verbal and speech is dysarthric.  HEENT: Normocephalic, atraumatic, pupils are equal, round and reactive to light and accommodation. Extraocular tracking is fair. She has global symmetrical bifacial weakness. Hearing is grossly intact. Airway examination reveals mild mouth dryness, thicker saliva, otherwise no focal findings. Speech is scant and dysarthric.  Chest: Clear to auscultation without wheezing, rhonchi or crackles noted.  Heart: S1+S2+0, Irregularly irregular with systolic murmur noted.   Abdomen: Soft, non-tender and non-distended with normal bowel sounds appreciated on auscultation.  Extremities: There is no pitting edema in the distal lower extremities bilaterally. Pedal pulses are intact.  Skin: Warm and dry without trophic changes noted.  Musculoskeletal: exam reveals no obvious joint deformities, tenderness or joint swelling or erythema. Missing tip of right thumb.  Neurologically:  Mental status: The patient is awake, alert and oriented to self and situation.  Cranial nerves II - XII are as described above under HEENT exam.  Motor exam: thin bulk, global strength of 4 out of 5, reflexes  diminished throughout, difficulty relaxing, difficulty opening fist, myotonia.   Cerebellar testing: No dysmetria or intention tremor, global impairment of fine motor skills noted. Sensory exam is intact to light touch, temperature and vibration. Gait, station and balance: She stands with difficulty, requires no assistance. She has scapular winging. She has overextended hip joints. She walks with difficulty. Gait is abnormal.   Assessment and Plan:    In summary, LESTA LIMBERT is a very pleasant 47 y.o.-year old female with an underlying medical history of developmental delays since childhood, history of anemia, history of cataracts with status post surgery, who Presents for neurologic consultation of her worsening weakness, recurrent falls, history of neuromuscular disease in the family and possibly previously confirmed personal history of neuromuscular disease. On examination she has global weakness including face and all 4 extremities. 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 I would recommend that she be plugged in with [facility] clinic either  at [facility] or [place] or [place]. Her mother is not quite ready to decide yet. She is advised that we can proceed with EMG and nerve conduction testing , and we will call them with the test results, in addition, she will likely benefit from therapy and I made a referral to neuro rehabilitation. She will likely need to start using some walking aid. I have encouraged her mother to call us with her decision on seeing a specialty clinic.addition, I advised the patient and her mother that they should make a follow-up appointment with cardiology. She had an irregular heartbeat and heart murmur. She has not seen cardiology in a couple of years as I understand. If she has muscular dystrophy, cardiac involvement is possible.  I answered all their questions today and the patient and her mother were in agreement.  Thank you very much for allowing me to  participate in the care of this nice patient. If I can be of any further assistance to you please do not hesitate to call me at [phone].  Sincerely,   Star Age, MD, PhD

## 2017-05-20 ENCOUNTER — Encounter: Payer: Self-pay | Admitting: Neurology

## 2017-05-20 ENCOUNTER — Ambulatory Visit: Payer: Medicaid Other | Admitting: Neurology

## 2017-05-20 ENCOUNTER — Ambulatory Visit (INDEPENDENT_AMBULATORY_CARE_PROVIDER_SITE_OTHER): Payer: Medicaid Other | Admitting: Neurology

## 2017-05-20 DIAGNOSIS — G7100 Muscular dystrophy, unspecified: Secondary | ICD-10-CM

## 2017-05-20 DIAGNOSIS — G7111 Myotonic muscular dystrophy: Secondary | ICD-10-CM | POA: Diagnosis not present

## 2017-05-20 DIAGNOSIS — R296 Repeated falls: Secondary | ICD-10-CM

## 2017-05-20 DIAGNOSIS — R6250 Unspecified lack of expected normal physiological development in childhood: Secondary | ICD-10-CM

## 2017-05-20 DIAGNOSIS — R269 Unspecified abnormalities of gait and mobility: Secondary | ICD-10-CM

## 2017-05-20 HISTORY — DX: Myotonic muscular dystrophy: G71.11

## 2017-05-20 NOTE — Procedures (Signed)
     HISTORY:  [name] is a 47 year old patient with a history of a muscular dystrophy that was diagnosed as a child.  The patient had developmental delay, she has had some cognitive slowing over time.  She has had some progressive weakness with the extremities, and increased difficulty with walking over the last 2 years.  The patient is being reevaluated for her muscular dystrophy.  NERVE CONDUCTION STUDIES:  Nerve conduction studies were performed on both lower extremities.  The distal motor latencies for the peroneal and posterior tibial nerves were within normal limits bilaterally.  A low motor amplitude was seen for the right peroneal nerve but was normal on the left peroneal nerve and for the posterior tibial nerves bilaterally.  Nerve conduction velocities for the peroneal and posterior tibial nerves were normal bilaterally.  The sensory latencies for the sural and peroneal nerves were normal bilaterally.  The F-wave latencies for the posterior tibial nerves were within normal limits bilaterally.  EMG STUDIES:  EMG study was performed on the right lower extremity:  The tibialis anterior muscle reveals 1 to 2 K motor units with full recruitment.  Diffuse myotonia was seen. The peroneus tertius muscle reveals 1 to 2 K motor units with full recruitment.  Diffuse myotonia was seen. The medial gastrocnemius muscle reveals 1 to 3K motor units with full recruitment.  Diffuse myotonia was seen. The vastus lateralis muscle reveals 1 to 3K motor units with full recruitment.  Diffuse myotonia was seen. The iliopsoas muscle reveals 1 to 2K motor units with full recruitment.  Diffuse myotonia was seen. The biceps femoris muscle (long head) reveals 1 to 2K motor units with full recruitment.  Diffuse myotonia was seen. The lumbosacral paraspinal muscles were tested at 3 levels, and revealed 2+ positive waves at all 3 levels tested. There was fair relaxation.   IMPRESSION:  Nerve conduction  studies done on both lower extremities do not show evidence of a peripheral neuropathy.  EMG evaluation of the right lower extremity shows diffuse myotonic changes.  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  Jill Alexanders MD 05/20/2017 4:23 PM  [facility] [address] [place], [place] [address]  Phone [phone] Fax [phone]

## 2017-05-20 NOTE — Progress Notes (Signed)
Please refer to EMG and nerve conduction study procedure note. 

## 2017-05-22 ENCOUNTER — Ambulatory Visit
Admission: RE | Admit: 2017-05-22 | Discharge: 2017-05-22 | Disposition: A | Discharge status: Home / Self Care | Payer: Medicaid Other | Source: Ambulatory Visit | Attending: Family Medicine | Admitting: Family Medicine

## 2017-05-22 DIAGNOSIS — Z1231 Encounter for screening mammogram for malignant neoplasm of breast: Secondary | ICD-10-CM

## 2017-05-22 NOTE — Progress Notes (Signed)
[facility]    Nerve / Sites Muscle Latency Ref. Amplitude Ref. Rel Amp Segments Distance Velocity Ref. Area    ms ms mV mV %  cm m/s m/s mVms  R Peroneal - EDB     Ankle EDB 6.3 ?6.5 1.8 ?2.0 100 Ankle - EDB 9   6.7     Fib head EDB 12.7  1.2  68.4 Fib head - Ankle 28 44 ?44 4.8     Pop fossa EDB 14.9  2.0  161 Pop fossa - Fib head 10 45 ?44 7.4         Pop fossa - Ankle      L Peroneal - EDB     Ankle EDB 6.0 ?6.5 2.8 ?2.0 100 Ankle - EDB 9   11.0     Fib head EDB 12.0  2.7  95.6 Fib head - Ankle 28 47 ?44 10.6     Pop fossa EDB 14.2  2.4  88.8 Pop fossa - Fib head 10 46 ?44 8.8         Pop fossa - Ankle      R Tibial - AH     Ankle AH 4.9 ?5.8 16.2 ?4.0 100 Ankle - AH 9   43.6     Pop fossa AH 13.5  12.0  73.8 Pop fossa - Ankle 37 43 ?41 34.4  L Tibial - AH     Ankle AH 5.2 ?5.8 17.6 ?4.0 100 Ankle - AH 9   47.8     Pop fossa AH 13.4  16.2  92 Pop fossa - Ankle 37 45 ?41 44.7             SNC    Nerve / Sites Rec. Site Peak Lat Ref.  Amp Ref. Segments Distance    ms ms V V  cm  R Sural - Ankle (Calf)     Calf Ankle 4.2 ?4.4 15 ?6 Calf - Ankle 14  L Sural - Ankle (Calf)     Calf Ankle 4.3 ?4.4 17 ?6 Calf - Ankle 14  R Superficial peroneal - Ankle     Lat leg Ankle 4.1 ?4.4 9 ?6 Lat leg - Ankle 14  L Superficial peroneal - Ankle     Lat leg Ankle 4.3 ?4.4 10 ?6 Lat leg - Ankle 14              F  Wave    Nerve F Lat Ref.   ms ms  R Tibial - AH 44.1 ?56.0  L Tibial - AH 44.6 ?56.0         EMG full

## 2017-05-28 ENCOUNTER — Other Ambulatory Visit: Payer: Self-pay

## 2017-05-28 ENCOUNTER — Ambulatory Visit: Payer: Medicaid Other | Attending: Neurology | Admitting: Speech Pathology

## 2017-05-28 DIAGNOSIS — R471 Dysarthria and anarthria: Secondary | ICD-10-CM

## 2017-05-28 DIAGNOSIS — R2681 Unsteadiness on feet: Secondary | ICD-10-CM | POA: Insufficient documentation

## 2017-05-28 DIAGNOSIS — R296 Repeated falls: Secondary | ICD-10-CM | POA: Insufficient documentation

## 2017-05-28 DIAGNOSIS — M6249 Contracture of muscle, multiple sites: Secondary | ICD-10-CM | POA: Diagnosis present

## 2017-05-28 DIAGNOSIS — R1312 Dysphagia, oropharyngeal phase: Secondary | ICD-10-CM

## 2017-05-28 DIAGNOSIS — R279 Unspecified lack of coordination: Secondary | ICD-10-CM | POA: Insufficient documentation

## 2017-05-28 DIAGNOSIS — R29898 Other symptoms and signs involving the musculoskeletal system: Secondary | ICD-10-CM | POA: Insufficient documentation

## 2017-05-28 DIAGNOSIS — M6281 Muscle weakness (generalized): Secondary | ICD-10-CM | POA: Diagnosis present

## 2017-05-28 DIAGNOSIS — R2689 Other abnormalities of gait and mobility: Secondary | ICD-10-CM | POA: Diagnosis present

## 2017-05-28 DIAGNOSIS — F809 Developmental disorder of speech and language, unspecified: Secondary | ICD-10-CM

## 2017-05-28 DIAGNOSIS — R29818 Other symptoms and signs involving the nervous system: Secondary | ICD-10-CM | POA: Insufficient documentation

## 2017-05-29 NOTE — Therapy (Signed)
[facility] [address] [place], [place], [address] Phone: [phone]   Fax:  [phone]  Speech Language Pathology Evaluation  Patient Details  Name: [name] MRN: [id number] Date of Birth: 11/05/1970 Referring Provider: Star Age, MD   Encounter Date: 05/28/2017    Past Medical History:  Diagnosis Date  . Fibroids   . Mental disorder   . 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 Pneumonia     Past Surgical History:  Procedure Laterality Date  . thumb surgery      There were no vitals filed for this visit.  Subjective Assessment - 05/28/17 1325    Subjective  Pt/mother report communication at baseline    Patient is accompained by:  Family member Hassan Rowan, mother (biological aunt)    Currently in Pain?  No/denies         SLP Evaluation [facility] - 05/28/17 1325      SLP Visit Information   SLP Received On  05/28/17    Referring Provider  Star Age, MD    Onset Date  05/05/2017- referral developmental delay since childhood    Medical Diagnosis  muscular dystrophy      Subjective   Subjective  Pt/mother report communication at baseline    Patient/Family Stated Goal  "make sure there's nothing we're missing."      General Information   HPI  TEQUIA King is a very pleasant 47 y.o.-year old female with an underlying medical history of developmental delays since childhood, history of anemia, history of cataracts with status post surgery, worsening weakness, recurrent falls, history of neuromuscular disease in the family and possibly previously confirmed personal history of neuromuscular disease; per MD notes ?myotonic dystrophy. History of cognitive and speech impairments at baseline.    Behavioral/Cognition  alert, cooperative, responds to open ended questions    Mobility Status  ambulated to session      Balance Screen   Has the patient fallen in the past 6 months  Yes PT eval on 4/30    How  many times?  3    Has the patient had a decrease in activity level because of a fear of falling?   -- family monitoring more closely    Is the patient reluctant to leave their home because of a fear of falling?   No      Prior Functional Status   Cognitive/Linguistic Baseline  Baseline deficits    Baseline deficit details  developmental speech delay; pt does not read    Type of Home  House     Lives With  Other (Comment) "Mom" is pt's biological aunt    Available Support  Family;Other (Comment) vocational program    Education  high school    Vocation  -- part time vocational program      Cognition   Overall Cognitive Status  History of cognitive impairments - at baseline    Attention  Sustained    Sustained Attention  Impaired    Sustained Attention Impairment  Functional complex      Auditory Comprehension   Overall Auditory Comprehension  Impaired at baseline    Yes/No Questions  Impaired    Basic Biographical Questions  76-100% accurate    Complex Questions  25-49% accurate    Commands  Within Functional Limits simple 1, 2 step commands intact    Conversation  Simple    Interfering Components  Attention;Processing speed    EffectiveTechniques  Extra processing time;Slowed  speech      Visual Recognition/Discrimination   Discrimination  Not tested      Reading Comprehension   Reading Status  Not tested      Expression   Primary Mode of Expression  Verbal      Verbal Expression   Overall Verbal Expression  Impaired at baseline    Initiation  No impairment    Automatic Speech  Name;Social Response    Level of Generative/Spontaneous Verbalization  Sentence    Naming  Not tested    Pragmatics  No impairment      Written Expression   Written Expression  Not tested      Oral Motor/Sensory Function   Overall Oral Motor/Sensory Function  Impaired    Labial ROM  Within Functional Limits    Labial Symmetry  Within Functional Limits    Labial Strength  Reduced mildly  decreased    Labial Sensation  Within Functional Limits    Labial Coordination  WFL    Lingual ROM  Within Functional Limits    Lingual Symmetry  Within Functional Limits    Lingual Strength  Within Functional Limits    Lingual Coordination  WFL    Facial ROM  Within Functional Limits    Facial Strength  Reduced minimal brow elevation    Velum  Within Functional Limits    Mandible  Impaired      Motor Speech   Overall Motor Speech  Impaired at baseline    Phonation  Normal    Resonance  Within functional limits    Articulation  Impaired    Level of Impairment  Sentence    Intelligibility  Intelligible    Motor Planning  Witnin functional limits    Interfering Components  Premorbid status    Phonation  WFL      Standardized Assessments   Standardized Assessments   Other Assessment    Other Assessment  Cognitive-linguistic assessment: Per mother's report, pt's speech and language are at baseline. Pt communicates verbally; she initiates minimal spontaneous output however responds appropriately to simple, open-ended questions and picture description tasks. Speech is >95% intelligible; there are occasional sound repetitions and pt uses an accelerated rate of speech when excited. Spontaneous responses average in low 70s dB. Vowel prolongation averages 10 seconds in duration. Alternating motion rates WFL. Expressive language is functional for making needs known; pt's mother denies difficulties communicating with pt at home. Mom reports pt occasionally uses gestures when she is unable to describe something she needs.    Swallowing Assessment: Oral motor examination is remarkable for mild bilateral facial weakness and mildly decreased jaw strength. SLP administered clinical swallowing evaluation due to facial weakness and history of recurrent PNA. Pt/mother deny history of coughing/choking with meals, weight has been stable. Pt consumed consecutive swallows of water via straw in excess of 3 oz  without overt signs of aspiration. There is minimal anterior escape with puree, which pt manages. Mastication adequate for regular solid with no oral residue. Swallow initiation appears timely, no overt signs of aspiration with any consistency.             SLP Education - 05/29/17 1904    Education provided  Yes    Education Details  swallow physiology, overt signs/sx of aspiration    Person(s) Educated  Patient;Parent(s)    Methods  Explanation    Comprehension  Verbalized understanding         05/28/17 1100  SLP Assessment/Plan  Clinical Impression Statement Ms.  Bollard presents with baseline speech and language impairments. Per pt/mother's report, pt's communication is adequate for making requests and needs known. Pt participates in a vocational day program and is able to communicate with friends and staff. Pt and mother report they are satisfied with pt's current functional level with regards to communication. Due to facial weakness and pt's history of PNA, SLP administered clinical swallowing evaluation to r/o need for further testing. Pt presents with oropharyngeal swallow which appears clinically within functional limits with adequate airway protection. No overt signs of aspiration observed despite challenging with consecutive straw sips of thin liquids in excess of 3oz. Recommend regular diet with thin liquids. At this time, mother reports her biggest concern for pt is frequent falls, and wishes to use therapy visits to address these concerns. No needs for skilled ST identified at this time; education provided re: overt signs of aspiration as well role of SLP should pt experience functional decline in communication or swallowing.   Speech Therapy Frequency One time visit  Treatment/Interventions Aspiration precaution training;Patient/family education  Potential to Achieve Goals Good  Potential Considerations Family/community support;Previous level of function  Consulted and Agree with  Plan of Care Patient;Family member/caregiver          Evaluation only; no further skilled therapeutic intervention recommended at this time for the following deficits and impairments:   Speech developmental delay  Dysarthria and anarthria  Dysphagia, oropharyngeal phase    Problem List Patient Active Problem List   Diagnosis Date Noted  . 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 Cyst of right ovary 04/29/2016  . Thickened endometrium 04/29/2016  . Mental impairment 07/14/2014   Deneise Lever, [place], [place] Speech-Language Pathologist  Aliene Altes 05/29/2017, 7:18 PM  [facility] [address] [place], [place], [address] Phone: [phone]   Fax:  [phone]  Name: TAKYA VANDIVIER MRN: [id number] Date of Birth: 26-Sep-1970

## 2017-06-01 ENCOUNTER — Telehealth: Payer: Self-pay | Admitting: Neurology

## 2017-06-01 DIAGNOSIS — G7111 Myotonic muscular dystrophy: Secondary | ICD-10-CM

## 2017-06-01 NOTE — Addendum Note (Signed)
Addended by: Lester Reliance A on: 06/01/2017 10:58 AM   Modules accepted: Orders

## 2017-06-01 NOTE — Telephone Encounter (Signed)
Please advise patient or her mother regarding pt's recent EMG and nerve conduction test. Findings suggest, as suspected, myotonic dystrophy. There is no evidence of neuropathy, which is nerve damage, thankfully. Nevertheless, as discussed, I would recommend that patient be plugged in with a specialty clinic, called [facility] clinic at one of the academic centers in the area. Please discuss again with mom if there is a place they would prefer such as [facility], [facility], or [facility]. I would be happy to make a referral.

## 2017-06-01 NOTE — Telephone Encounter (Signed)
I called pt's aunt, Barbera Setters, per DPR, and discussed pt's EMG results. Pt's aunt verbalized understanding of results and asked that a referral be sent to [facility], as pt has been seen at [facility] before. Pt's aunt had no questions at this time but understood to call us back if questions or concerns

## 2017-06-02 ENCOUNTER — Ambulatory Visit: Payer: Self-pay | Admitting: Physical Therapy

## 2017-06-02 ENCOUNTER — Encounter: Payer: Self-pay | Admitting: Occupational Therapy

## 2017-06-09 ENCOUNTER — Other Ambulatory Visit: Payer: Self-pay

## 2017-06-09 ENCOUNTER — Encounter: Payer: Self-pay | Admitting: *Deleted

## 2017-06-09 ENCOUNTER — Encounter: Payer: Self-pay | Admitting: Physical Therapy

## 2017-06-09 ENCOUNTER — Ambulatory Visit: Payer: Medicaid Other | Admitting: *Deleted

## 2017-06-09 ENCOUNTER — Ambulatory Visit: Payer: Medicaid Other | Admitting: Physical Therapy

## 2017-06-09 DIAGNOSIS — R296 Repeated falls: Secondary | ICD-10-CM

## 2017-06-09 DIAGNOSIS — F809 Developmental disorder of speech and language, unspecified: Secondary | ICD-10-CM | POA: Diagnosis not present

## 2017-06-09 DIAGNOSIS — M6281 Muscle weakness (generalized): Secondary | ICD-10-CM

## 2017-06-09 DIAGNOSIS — R278 Other lack of coordination: Secondary | ICD-10-CM

## 2017-06-09 DIAGNOSIS — R2681 Unsteadiness on feet: Secondary | ICD-10-CM

## 2017-06-09 DIAGNOSIS — R29818 Other symptoms and signs involving the nervous system: Secondary | ICD-10-CM

## 2017-06-09 DIAGNOSIS — R29898 Other symptoms and signs involving the musculoskeletal system: Secondary | ICD-10-CM

## 2017-06-09 DIAGNOSIS — M6249 Contracture of muscle, multiple sites: Secondary | ICD-10-CM

## 2017-06-09 DIAGNOSIS — R279 Unspecified lack of coordination: Secondary | ICD-10-CM

## 2017-06-09 DIAGNOSIS — R2689 Other abnormalities of gait and mobility: Secondary | ICD-10-CM

## 2017-06-09 NOTE — Patient Instructions (Signed)
Grip Strengthening (Resistive Putty)    Squeeze putty using thumb and all fingers. Do this with both hands. Repeat _1__ times. Do __5-10__ sessions per day.   Three Jaw Chuck Pinch Strengthening (Resistive Putty)    Pull putty, using thumb, index and middle fingers. Do this with both hands. Repeat __5__ times. Do __1_ sessions per day.  Lateral Pinch Strengthening (Resistive Putty)    Squeeze between thumb and side of each finger in turn. Do this with both hands. Repeat __5__ times. Do __1__ sessions per day.  Copyright  VHI. All rights reserved.

## 2017-06-09 NOTE — Therapy (Signed)
[facility] [address] [place], [place], [address] Phone: [phone]   Fax:  [phone]  Occupational Therapy Evaluation  Patient Details  Name: [name] MRN: [id number] Date of Birth: 1970-12-04 Referring Provider: Lucienne Capers, MD   Encounter Date: 06/09/2017  OT End of Session - 06/09/17 1255    Visit Number  1    Number of Visits  4 Requesting Eval + 3 additional visits    Authorization Type  Medicaid - waiting for authorization - requesting Eval + up to 3 additional visits    OT Start Time  0943    OT Stop Time  1028    OT Time Calculation (min)  45 min    Activity Tolerance  Patient tolerated treatment well    Behavior During Therapy  [facility] for tasks assessed/performed       Past Medical History:  Diagnosis Date  . Fibroids   . Mental disorder   . 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 Pneumonia     Past Surgical History:  Procedure Laterality Date  . thumb surgery      There were no vitals filed for this visit.  Subjective Assessment - 06/09/17 0948    Subjective   Pt presents today for OT Assessment and eval/treatment. Pt sister states that pt is here secondary to "falling a lot' secondary to a muscle disease.     Patient is accompained by:  Family member Sister - [place]    Pertinent History  h/o mental impairment; myotonic dystrophy    Currently in Pain?  No/denies    Multiple Pain Sites  No        [facility] OT Assessment - 06/09/17 0001      Assessment   Medical Diagnosis  Myotonic Dystrophy    Referring Provider  Lucienne Capers MD    Onset Date/Surgical Date  -- "Since Birth" per family report    Hand Dominance  Left    Next MD Visit  -- "Yes, Not sure when" per sister and mother reports      Precautions   Precautions  Fall      Balance Screen   Has the patient fallen in the past 6 months  Yes    How many times?  3    Has the patient had a decrease in activity level because  of a fear of falling?   Yes family monitors more closely    Is the patient reluctant to leave their home because of a fear of falling?   No      Home  Environment   Family/patient expects to be discharged to:  Private residence    Living Arrangements  Parent    Available Help at Discharge  Family    Type of [place] ? 12 STE    Home Layout  Two level    Alternate Level Stairs - Number of Steps  -- 6-7 steps up and 6-7 steps down    Bathroom Shower/Tub  Tub/Shower unit;Curtain    Lives With  -- Mother and father; two adult sisters      Prior Function   Level of Independence  Independent    Vocation  Other (comment) Works/goes to school at [employer]  "I go to different classrooms, does exercise, cooking arts and crafts" etc.    Leisure  watches TV      ADL  Eating/Feeding  Independent    ADL comments  Overall independent with ADL's & simple meal prep, does chores at home. Sister reports Mod I      IADL   Shopping  Needs to be accompanied on any shopping trip Goes shopping with family    Meal Prep  Able to complete simple cold meal and snack prep Packs own lunch daily      Mobility   Mobility Status  History of falls    Mobility Status Comments  Pt sister reports that pt trips sometimes when she gets up from standing at home or when outside going to car or on/off [employer] bus  Loses her balance and has difficulty recovering      Written Expression   Dominant Hand  Left      Vision - History   Baseline Vision  No visual deficits    Additional Comments  Does not wear glasses      Cognition   Overall Cognitive Status  History of cognitive impairments - at baseline H/O mental impairment at birth    Attention  Sustained    Sustained Attention  Impaired    Sustained Attention Impairment  Functional complex      Sensation   Light Touch  Appears Intact      Coordination   Gross Motor Movements are Fluid and Coordinated  Yes    Fine  Motor Movements are Fluid and Coordinated  -- Overall WFL's, 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 Do this with both hands. Repeat _1__ times. Do __5-10__ sessions per day.   Three Jaw Chuck Pinch Strengthening (Resistive Putty)    Pull putty, using thumb, index and middle fingers. Do this with both hands. Repeat __5__ times. Do __1_ sessions per day.  Lateral Pinch Strengthening (Resistive Putty)    Squeeze between thumb and side of each finger in turn. Do this with both hands. Repeat __5__ times. Do __1__ sessions per day.  Copyright  VHI. All rights reserved.            OT Treatments/Exercises (OP) - 06/09/17 0001      Exercises   Exercises  Hand;Theraputty      Theraputty   Theraputty - Grip  Yellow putty bilateral grip 5 reps 1x/day. Adjust PRN based on fatigue    Theraputty - Pinch  3 point, lateral pinch 5 reps only 1x/day Stressed no ex with increased activity at [employer]            OT Education - 06/09/17 1252    Education provided  Yes    Education Details  Home program for yellow putty grip/pinches 1x/day x 5 reps each. Pt/sister  educated to d/c or decrease with increased functional use at home/work or w/ fatigue secondary to myotonic dystrophy    Person(s) Educated  Patient;Other (comment) Adult Sister   Adult Sister   Methods  Explanation;Demonstration;Verbal  cues;Handout    Comprehension  Verbalized understanding       OT Short Term Goals - 06/09/17 1311      OT SHORT TERM GOAL #1   Title  STG's = LTG's        OT Long Term Goals - 06/09/17 1311      OT LONG TERM GOAL #1   Title  Pt/family will be Mod I home program    Baseline  06/09/17 Req min A and vc's    Time  4    Period  Weeks    Status  New    Target Date  07/07/17      OT LONG TERM GOAL #2   Title  Pt/family will i'ly state 2-3 energy conservation techniques     Baseline  Dependent    Time  4    Period  Weeks    Status  New    Target Date  07/07/17      OT LONG TERM GOAL #3   Title  Pt/family with verbalize 2-3 possible options for a/e during homemaking/ADL's/in the kitchen etc.    Baseline  Dependent    Time  4    Period  Weeks    Status  New    Target Date  07/07/17            Plan - 06/09/17 1258    Clinical Impression Statement  Pt is a pleasant 47 y/o LHD female with h/o cognitive impairments whom was referred today w/ dx: Myotonic dystrophy for OT Eval and treatment. Her adult sister is with her today and reports that she has "had this since she was born". Pt/sister state that she has had a h/o falls and they are currently more concerned with gait/balance and ambulation. She has a PT Evaluation today as well. She is overall independent with ADL's and cold meal prep, she has household chores and goes to a day program M-F. She sometimes has difficulty opening waterbottles or jars when in the kitchen etc. She demonstrates deficits in endurance, activity tolerance and should benefit from out-pt OT for pt/family education, home program and possible a/e for home use to assist with increased independence.      Occupational performance deficits (Please refer to evaluation for details):  ADL's;IADL's    Rehab Potential  Good    Current Impairments/barriers affecting progress:  H/o decreased cognition, myotronic dystrophy since birth per adult sister report.    OT  Frequency  Other (comment) Eval plus up to 3 additional visits over next 4 weeks    OT Duration  4 weeks    OT Treatment/Interventions  Self-care/ADL training;Therapeutic exercise;Patient/family education;Energy conservation;Therapeutic activities;DME and/or AE instruction    Plan  Review home program for yellow putty, assess pt/family understanding of decrease ex with increased functional use to avoid fatigue. EC techniques, discuss possible a/e for home use (especially in kitchen etc).    Clinical Decision Making  Limited treatment options, no task modification necessary    Consulted and Agree with Plan of Care  Patient;Family member/caregiver    Family Member Consulted  Sister       Patient will benefit from skilled therapeutic intervention in order to improve the following deficits and impairments:  Decreased knowledge of use of DME, Decreased  coordination, Decreased activity tolerance, Decreased endurance, Decreased strength, Impaired UE functional use(Note: PT to treat Abnormal gait, decreased balance)  Visit Diagnosis: Muscle weakness (generalized) - Plan: Ot plan of care cert/re-cert  Decreased coordination - Plan: Ot plan of care cert/re-cert  Other symptoms and signs involving the nervous system - Plan: Ot plan of care cert/re-cert  Other symptoms and signs involving the musculoskeletal system - Plan: Ot plan of care cert/re-cert    Problem List Patient Active Problem List   Diagnosis Date Noted  . 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 Cyst of right ovary 04/29/2016  . Thickened endometrium 04/29/2016  . Mental impairment 07/14/2014    Almyra Deforest, OTR/L 06/09/2017, 1:20 PM  [facility] [address] [place], [place], [address] Phone: [phone]   Fax:  [phone]  Name: [name] MRN: [id number] Date of Birth: 17-Jul-1970

## 2017-06-10 NOTE — Therapy (Signed)
[facility] [address] [place], [place], [address] Phone: [phone]   Fax:  [phone]  Physical Therapy Evaluation  Patient Details  Name: [name] MRN: [id number] Date of Birth: May 07, 1970 Referring Provider: Lucienne Capers, MD   Encounter Date: 06/09/2017  PT End of Session - 06/09/17 2209    Visit Number  1    Number of Visits  13    Authorization Type  Medicaid    PT Start Time  5638    PT Stop Time  1140    PT Time Calculation (min)  38 min       Past Medical History:  Diagnosis Date  . Fibroids   . Mental disorder   . 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 Pneumonia     Past Surgical History:  Procedure Laterality Date  . thumb surgery      There were no vitals filed for this visit.   Subjective Assessment - 06/09/17 1108    Subjective  This 47yo female was referred on 05/18/2017 by Star Age, MD for PT, OT & speech evaluations with diagnosis of Muscular Dystrophy, recurrent falls and gait disorder. 05/20/2017 Nerve conduction studies done on both lower extremities do not show evidence of a peripheral neuropathy.  EMG evaluation of the right lower extremity shows diffuse myotonic changes.  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    Patient is accompained by:  Family member sister, Lowell Bouton    Limitations  Lifting;Standing;Walking;House hold activities    Patient Stated Goals  to reduce falls, strength or maintain muscles    Currently in Pain?  No/denies         [facility] PT Assessment - 06/09/17 1100      Assessment   Medical Diagnosis  Myotonic Dystrophy    Referring Provider  Lucienne Capers, MD    Onset Date/Surgical Date  05/18/17 MD office visit with PT referral    Hand Dominance  Left      Precautions   Precautions  Fall      Balance Screen   Has the patient fallen in the past 6  months  Yes    How many times?  4-5 x/month left foot fx, reports legs give out    Has the patient had a decrease in activity level because of a fear of falling?   Yes    Is the patient reluctant to leave their home because of a fear of falling?   No always with family      Transport planner  Private residence    Living Arrangements  Parent;Other relatives mom, dad, 2 sisters (81 & 39yo)    Available Help at Discharge  Family;Available 24 hours/day    Type of Home  House    Home Access  Stairs to enter    Entrance Stairs-Number of Steps  12    Entrance Stairs-Rails  Right;Left;Can reach both    Home Layout  Two level split level     Alternate Level Stairs-Number of Steps  6    Alternate Level Stairs-Rails  Right    Home Equipment  Shower seat      Prior Function   Level of Independence  Independent with household mobility without device;Independent with community mobility without device family hold for curbs & ramps, uneven ground    Leisure  watches TV  Posture/Postural Control   Posture/Postural Control  Postural limitations    Postural Limitations  Rounded Shoulders;Forward head;Flexed trunk      ROM / Strength   AROM / PROM / Strength  Strength;PROM      PROM   Overall PROM   Deficits    PROM Assessment Site  Ankle    Right/Left Ankle  Right;Left    Right Ankle Dorsiflexion  -11    Left Ankle Dorsiflexion  -18      Strength   Overall Strength  Deficits    Overall Strength Comments  trunk tested sitting grossly 4/5 single contraction test    Strength Assessment Site  Hip;Knee;Ankle    Right/Left Hip  Right;Left    Right Hip Flexion  3+/5    Right Hip Extension  2+/5    Right Hip ABduction  3-/5    Left Hip Flexion  4-/5    Left Hip Extension  3-/5    Left Hip ABduction  3-/5    Right/Left Knee  Right;Left    Right Knee Flexion  4/5    Right Knee Extension  4/5    Left Knee Flexion  4/5    Left Knee Extension  4/5    Right/Left Ankle   Right;Left    Right Ankle Dorsiflexion  4/5    Left Ankle Dorsiflexion  4/5      Flexibility   Soft Tissue Assessment /Muscle Length  yes    Hamstrings  right -49 & left -41 tested supine hip 90* extending knee      Transfers   Transfers  Sit to Stand;Stand to Sit    Sit to Stand  5: Supervision;With upper extremity assist;With armrests;From chair/3-in-1 uses back of legs against chair to stabilize    Stand to Sit  5: Supervision;With upper extremity assist;With armrests;To chair/3-in-1      Ambulation/Gait   Ambulation/Gait  Yes    Ambulation/Gait Assistance  5: Supervision;4: Min assist supervision straight path, MinA     Ambulation/Gait Assistance Details  family reports she walks in home unassisted but touches furniture for balance & outdoors hand hold family assist    Ambulation Distance (Feet)  100 Feet    Assistive device  1 person hand held assist;None    Gait Pattern  Step-through pattern;Decreased arm swing - right;Decreased stance time - right;Decreased step length - left;Decreased stride length;Right steppage;Left steppage;Right foot flat;Left foot flat;Right genu recurvatum;Left genu recurvatum;Lateral hip instability;Trunk flexed;Narrow base of support;Poor foot clearance - left;Poor foot clearance - right    Ambulation Surface  Indoor;Level    Gait velocity  2.71      Standardized Balance Assessment   Standardized Balance Assessment  Timed Up and Go Test;Berg Balance Test      Berg Balance Test   Sit to Stand  Needs minimal aid to stand or to stabilize    Standing Unsupported  Able to stand 2 minutes with supervision    Sitting with Back Unsupported but Feet Supported on Floor or Stool  Able to sit safely and securely 2 minutes    Stand to Sit  Controls descent by using hands    Transfers  Able to transfer safely, definite need of hands    Standing Unsupported with Eyes Closed  Needs help to keep from falling    Standing Ubsupported with Feet Together  Needs help to  attain position and unable to hold for 15 seconds    From Standing, Reach Forward with Outstretched Arm  Reaches forward  but needs supervision    From Standing Position, Pick up Object from [place] to pick up shoe, needs supervision    From Standing Position, Turn to Look Behind Over each Shoulder  Needs supervision when turning    Turn 360 Degrees  Needs assistance while turning    Standing Unsupported, Alternately Place Feet on Step/Stool  Needs assistance to keep from falling or unable to try    Standing Unsupported, One Foot in [place] balance while stepping or standing    Standing on One Leg  Unable to try or needs assist to prevent fall    Total Score  19      Timed Up and Go Test   Normal TUG (seconds)  10.65                Objective measurements completed on examination: See above findings.                PT Short Term Goals - 06/09/17 1805      PT SHORT TERM GOAL #1   Title  Family verbalizes understanding of general guidelines for exercising with myotonic dystrophy. (All STGs Target Date 5th visit)    Baseline  Family are unknowledgeable in exercising guidelines with new diagnosis of myotonic dystrophy    Time  4    Period  Weeks    Status  New    Target Date  07/08/17      PT SHORT TERM GOAL #2   Title  Family demonstrates HEP for stretches to improve flexibility.     Baseline  Family is dependent in proper stretches and she has decreased range in trunk, hamstrings & gastroc.     Time  4    Period  Weeks    Status  New    Target Date  07/08/17      PT SHORT TERM GOAL #3   Title  Family demonstrates initial HEP to address strength & balance deficits.     Baseline  Family is dependent in exercises to improve strength & balance deficits.    Time  4    Period  Weeks    Status  New    Target Date  07/08/17        PT Long Term Goals - 06/09/17 2011      PT LONG TERM GOAL #1   Title  Family demonstrates understanding of ongoing HEP  for flexibility and range. (All LTGs Target Date 13th visit)    Baseline  Family are dependent in exercises with myotonic dystrophy and ROM are impacting balance /falls.     Time  12    Period  Weeks    Status  New    Target Date  09/01/17      PT LONG TERM GOAL #2   Title  Family verbalize and demonstrate ongoing HEP for strength, balance & endurance with Myotonic Dystrophy.     Baseline  Family is dependent in proper exercises with Myotonic Dystrophy.     Time  12    Period  Weeks    Status  New    Target Date  09/01/17      PT LONG TERM GOAL #3   Title  Ankle dorsiflexion PROM >-5* bilateral.     Baseline  Right ankle dorsiflexion -11* and left -18*    Time  12    Period  Weeks    Status  New    Target Date  09/01/17  PT LONG TERM GOAL #4   Title  Berg Balance >25/56 to indicate lower fall risk with standing activities.    Baseline  Merrilee Jansky Balance 19/56    Time  12    Period  Weeks    Status  New    Target Date  09/01/17      PT LONG TERM GOAL #5   Title  Patient ambulates around furniture 43' with supervision.     Baseline  Patient requires minA to negotiate around furniture safely.     Time  12    Period  Weeks    Status  New    Target Date  09/01/17      Additional Long Term Goals   Additional Long Term Goals  Yes      PT LONG TERM GOAL #6   Title  Family demonstrates safe assist with ambulating outdoors on paved & grass surfaces, ramps, curbs & stairs including ability to determine Tranisha's endurance limitations.     Baseline  Family is dependent in safe techniques for community ambulation with new diagnosis of myotonic dystrophy.     Time  12    Period  Weeks    Status  New    Target Date  09/01/17             Plan - 06/09/17 2051    Clinical Impression Statement  This 47yo female has had impaired mobility with cognitive deficits since birth but has increased weakness with recurrent falls recently. She was diagnosed with Type 1 myotonic dystrophy.  Her family are dependent in proper exercises & activiites with new diagnosis. She has decreased PROM & flexibility in trunk and lower extremities. She ambulates with bilateral knee recurvatum related to ROM deficits. She has weakness in lower exrtremities which progressively decrease with muscle fatigue. Berg Balance Test 19/56 indicates high fall risk. Her gait is dependent with deficits indicating high fall risk.     History and Personal Factors relevant to plan of care:  mental handicap, myotonic dystrophy    Clinical Presentation  Evolving    Clinical Presentation due to:  recent myotonic dystrophy diagnosis without family understanding    Clinical Decision Making  Moderate    Rehab Potential  Good    Clinical Impairments Affecting Rehab Potential  impaired cognition    PT Frequency  1x / week    PT Duration  12 weeks    PT Treatment/Interventions  ADLs/Self Care Home Management;DME Instruction;Gait training;Stair training;Functional mobility training;Therapeutic activities;Therapeutic exercise;Balance training;Neuromuscular re-education;Patient/family education;Passive range of motion    PT Next Visit Plan  initiate HEP stretches & strength, instruct family in exercise guidelines with myotonic dystrophy    Consulted and Agree with Plan of Care  Patient       Patient will benefit from skilled therapeutic intervention in order to improve the following deficits and impairments:  Abnormal gait, Decreased activity tolerance, Decreased balance, Decreased cognition, Decreased coordination, Decreased endurance, Decreased knowledge of use of DME, Decreased knowledge of precautions, Decreased mobility, Decreased range of motion, Decreased strength, Impaired flexibility, Postural dysfunction  Visit Diagnosis: Repeated falls  Muscle weakness (generalized)  Contracture of muscle, multiple sites  Unsteadiness on feet  Other abnormalities of gait and mobility     Problem List Patient Active  Problem List   Diagnosis Date Noted  . 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 Cyst of right ovary 04/29/2016  . Thickened endometrium 04/29/2016  . Mental impairment 07/14/2014    Jamey Reas, PT, DPT  06/09/2017, 8:55 PM  [facility] [address] [place], [place], [address] Phone: [phone]   Fax:  [phone]  Name: [name] MRN: [id number] Date of Birth: [birth year]

## 2017-06-17 ENCOUNTER — Telehealth: Payer: Self-pay | Admitting: Physical Therapy

## 2017-06-17 ENCOUNTER — Ambulatory Visit: Payer: Medicaid Other | Admitting: Physical Therapy

## 2017-06-17 ENCOUNTER — Ambulatory Visit: Payer: Medicaid Other | Attending: Neurology | Admitting: Occupational Therapy

## 2017-06-17 DIAGNOSIS — R29898 Other symptoms and signs involving the musculoskeletal system: Secondary | ICD-10-CM | POA: Insufficient documentation

## 2017-06-17 DIAGNOSIS — M6249 Contracture of muscle, multiple sites: Secondary | ICD-10-CM | POA: Insufficient documentation

## 2017-06-17 DIAGNOSIS — R279 Unspecified lack of coordination: Secondary | ICD-10-CM | POA: Insufficient documentation

## 2017-06-17 DIAGNOSIS — M6281 Muscle weakness (generalized): Secondary | ICD-10-CM | POA: Insufficient documentation

## 2017-06-17 DIAGNOSIS — R29818 Other symptoms and signs involving the nervous system: Secondary | ICD-10-CM | POA: Insufficient documentation

## 2017-06-17 NOTE — Telephone Encounter (Signed)
Called pt's mom about missed PT visit today at 8:45 and that they have an OT appt at 9:30. Left call back number as well.  Willow Ora, PTA, [facility] [address], [place], [place] [address] [phone] 06/17/17, 9:11 AM

## 2017-06-24 ENCOUNTER — Encounter: Payer: Self-pay | Admitting: Physical Therapy

## 2017-06-24 ENCOUNTER — Ambulatory Visit: Payer: Medicaid Other | Admitting: Physical Therapy

## 2017-06-24 ENCOUNTER — Encounter: Payer: Self-pay | Admitting: *Deleted

## 2017-06-24 ENCOUNTER — Ambulatory Visit: Payer: Medicaid Other | Admitting: *Deleted

## 2017-06-24 DIAGNOSIS — R29818 Other symptoms and signs involving the nervous system: Secondary | ICD-10-CM

## 2017-06-24 DIAGNOSIS — R279 Unspecified lack of coordination: Secondary | ICD-10-CM | POA: Diagnosis present

## 2017-06-24 DIAGNOSIS — R278 Other lack of coordination: Secondary | ICD-10-CM

## 2017-06-24 DIAGNOSIS — R29898 Other symptoms and signs involving the musculoskeletal system: Secondary | ICD-10-CM | POA: Diagnosis present

## 2017-06-24 DIAGNOSIS — M6281 Muscle weakness (generalized): Secondary | ICD-10-CM

## 2017-06-24 DIAGNOSIS — M6249 Contracture of muscle, multiple sites: Secondary | ICD-10-CM | POA: Diagnosis present

## 2017-06-24 NOTE — Patient Instructions (Addendum)
Chair Sitting    Sit at edge of seat, spine straight, one leg extended. Put a hand on each thigh and bend forward from the hip, keeping spine straight. Allow hand on extended leg to reach toward toes. Support upper body with other arm. Hold _30_ seconds. Repeat 2_ times each leg with 5-6 seconds rest between each rep.  Do 1 sessions per day.  Copyright  VHI. All rights reserved.    Bridge    Lying as shown: lift hips up and hold for 5 seconds. Then lower hips down an hold for 5 seconds.  Repeat _10_ times. Do _1-2_ sessions per day.  http://pm.exer.us/55   Copyright  VHI. All rights reserved.      Strengthening: Hip Abductor - Resisted    Lying flat on bed (not as shown): With yellow band looped around both legs above knees, push thighs apart and hold for 5 seconds. Bring knee back together slowly and hold for 5 seconds.  Repeat _10_ times per set. Do _1_ sets per session. Do _1-2_ sessions per day.  http://orth.exer.us/688   Copyright  VHI. All rights reserved.     Seated in chair or at edge of bed/sofa: slowly lift one foot to straighten knee, hold for 5 seconds, then lower back down. Rest for 5 seconds. Repeat for 10 reps.   Then repeat with other leg. 5 second holds with each re. 5 second rest between each rep.   Perform 1 time a day.

## 2017-06-24 NOTE — Therapy (Signed)
[facility] [address] [place], [place], [address] Phone: [phone]   Fax:  [phone]  Occupational Therapy Treatment  Patient Details  Name: [name] MRN: [id number] Date of Birth: 18-Apr-1970 Referring Provider: Lucienne Capers, MD   Encounter Date: 06/24/2017  OT End of Session - 06/24/17 1056    Visit Number  2    Number of Visits  4 Requested Eval + 3 additional visits    Authorization Type  Medicaid - authorization for Eval + up to 3 additional visits    Authorization - Visit Number  2    Authorization - Number of Visits  4    OT Start Time  1013    OT Stop Time  1048    OT Time Calculation (min)  35 min    Activity Tolerance  Patient tolerated treatment well    Behavior During Therapy  [facility] for tasks assessed/performed       Past Medical History:  Diagnosis Date  . Fibroids   . Mental disorder   . 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 Pneumonia     Past Surgical History:  Procedure Laterality Date  . thumb surgery      There were no vitals filed for this visit.  Subjective Assessment - 06/24/17 1014    Subjective   Pt denies pain. Denies any changes since last visit.    Patient is accompained by:  Family member Mother    Pertinent History  h/o mental impairment; myotonic dystrophy    Currently in Pain?  No/denies    Multiple Pain Sites  No                   OT Treatments/Exercises (OP) - 06/24/17 0001      ADLs   ADL Comments  Instructed pt and her mother in energy conservation techniques. Reviewed possible a/e for increased independence and use at home - discussed dycem or other grip material/bottle/jar opener for opening bottles/soda etc. Reivewed a/e book and discussed possibilities. Issued cylindrical foam for home use on utensils/toothbrush etc.    ADL Education Given  Yes      Exercises   Exercises  Hand;Theraputty      Theraputty   Theraputty - Grip  Yellow  putty bilateral grip 5 reps Min vc's, Stressed taking her time, not fatiguing     Theraputty - Pinch  3 point, lateral pinch 5 reps only 1x/day Min vc's - Stressed slowing down/take Sales executive Techniques  1. Sit for as many activities as possible. 2. Use slow, smooth movements.  Rushing increases discomfort. 3. Determine the necessity of performing the task.  Simplify those tasks that are necessary.  (Get clothes out of the dryer when they are warm instead of ironing, let dishes air dry, etc.) 4. Take frequent rests both during and between activities.  Avoid repetitive tasks. 5. Pre-plan your activities; try a daily and/or weekly schedule.  Spread out the activities that are most fatiguing (break up cleaning tasks over multiple days). 6. Remember to plan a balance of work, rest and recreation. 7. Consider the best time for each activity.  Do the most exertive task when you have the most energy. 8. Don't carry items if you can push them.  Slide, don't lift. Push, don't pull. 9. Utilize two hands when appropriate. 10. Maintain good posture and use proper body mechanics.  Avoid remaining in one position for  too long.  When lifting, bend at the knees, not at the waist.  Exhale when bending down, inhale when straightening up.  Carry objects as close to your body and as near to the center of the pelvis.  11. Avoid wasted body movements (position yourself for the task so that you avoid bending, twisting, etc. when possible). 12. Select the best working environment.  Consider lighting, ventilation, clothing, and equipment. 110. Organize your storage areas, making the items you use daily convenient.  Store heaviest items at waist height.Store frequently used items between shoulders and knee height.  Consider leaving frequently used       items on countertops.  (You can organize in storage baskets based on time used/purpose). 14. Feelings and emotions can be real  causes of fatigue.  Try to avoid unnecessary worry, irritation, or frustration.  Avoid stress, it can also be a source of fatigue. 15. Get help from other people for difficult tasks. 16. Explore equipment or items that may be able to do the job for you with greater ease.  (Electric can openers, blenders, lightweight items for cleaning, etc.)      OT Education - 06/24/17 1054    Education provided  Yes    Education Details  Energy conservation techniques, possible a/e for ADL's, yellow theraputty ex's - slow down or decrease ex's when she has done increased functional activitiy, spread out tasks, avoid fatigue.    Person(s) Educated  Patient;Parent(s) Mother   Mother   Methods  Explanation;Demonstration;Verbal cues;Handout    Comprehension  Verbalized understanding;Returned demonstration;Need further instruction       OT Short Term Goals - 06/09/17 1311      OT SHORT TERM GOAL #1   Title  STG's = LTG's        OT Long Term Goals - 06/09/17 1311      OT LONG TERM GOAL #1   Title  Pt/family will be Mod I home program    Baseline  06/09/17 Req min A and vc's    Time  4    Period  Weeks    Status  New    Target Date  07/07/17      OT LONG TERM GOAL #2   Title  Pt/family will i'ly state 2-3 energy conservation techniques     Baseline  Dependent    Time  4    Period  Weeks    Status  New    Target Date  07/07/17      OT LONG TERM GOAL #3   Title  Pt/family with verbalize 2-3 possible options for a/e during homemaking/ADL's/in the kitchen etc.    Baseline  Dependent    Time  4    Period  Weeks    Status  New    Target Date  07/07/17            Plan - 06/24/17 1057    Clinical Impression Statement  Pt should benefit from energy conservation techniques and possible a/e for ADL's as discussed in clinic today. Avoidance of fatigue or overuse secondary to dx: myotonic dystrophy. Continue with endurance, activity tolerance & pt/family education toward plan of care and  goals.    Occupational performance deficits (Please refer to evaluation for details):  ADL's;IADL's    Rehab Potential  Good    Current Impairments/barriers affecting progress:  H/o decreased cognition, myotronic dystrophy since birth per adult sister report.    OT Frequency  Other (comment)    OT Duration  4 weeks    OT Treatment/Interventions  Self-care/ADL training;Therapeutic exercise;Patient/family education;Energy conservation;Therapeutic activities;DME and/or AE instruction    Plan  Assess pt/family understanding of decrease ex w/ increased functional use to avoid fatigue. FM/coordination and putty ex's. Review energy conservation and begin checking goals.    Clinical Decision Making  Limited treatment options, no task modification necessary    Consulted and Agree with Plan of Care  Patient;Family member/caregiver    Family Member Consulted  Mother       Patient will benefit from skilled therapeutic intervention in order to improve the following deficits and impairments:  Decreased knowledge of use of DME, Decreased coordination, Decreased activity tolerance, Decreased endurance, Decreased strength, Impaired UE functional use  Visit Diagnosis: Decreased coordination  Muscle weakness (generalized)  Other symptoms and signs involving the nervous system  Other symptoms and signs involving the musculoskeletal system    Problem List Patient Active Problem List   Diagnosis Date Noted  . Myotonic dystrophy, type 1 ([facility]) 05/20/2017  . Cyst of right ovary 04/29/2016  . Thickened endometrium 04/29/2016  . Mental impairment 07/14/2014    Almyra Deforest, OTR/L 06/24/2017, 11:02 AM  [facility] [address] [place], [place], [address] Phone: [phone]   Fax:  [phone]  Name: [name] MRN: [id number] Date of Birth: Nov 17, 1970

## 2017-06-24 NOTE — Patient Instructions (Signed)

## 2017-06-24 NOTE — Therapy (Signed)
[facility] [address] [place], [place], [address] Phone: [phone]   Fax:  [phone]  Physical Therapy Treatment  Patient Details  Name: [name] MRN: [id number] Date of Birth: 1970/05/16 Referring Provider: Lucienne Capers, MD   Encounter Date: 06/24/2017  PT End of Session - 06/24/17 0939    Visit Number  2    Number of Visits  13    Authorization Type  Medicaid    PT Start Time  0933    PT Stop Time  1015    PT Time Calculation (min)  42 min    Equipment Utilized During Treatment  Gait belt    Activity Tolerance  Patient tolerated treatment well    Behavior During Therapy  [facility] for tasks assessed/performed       Past Medical History:  Diagnosis Date  . Fibroids   . Mental disorder   . 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 Pneumonia     Past Surgical History:  Procedure Laterality Date  . thumb surgery      There were no vitals filed for this visit.  Subjective Assessment - 06/24/17 0936    Subjective  No new complaints. Had a fall on Saturday when her foot slipped on car runner getting in due to rain and fell to ground. Pt denies any injury. No pain currently.     Patient is accompained by:  Family member    Limitations  Lifting;Standing;Walking;House hold activities    Patient Stated Goals  to reduce falls, strength or maintain muscles    Currently in Pain?  No/denies      Treatment: Issued the following to pt's HEP today. Cues needed on ex form and technique.  Chair Sitting    Sit at edge of seat, spine straight, one leg extended. Put a hand on each thigh and bend forward from the hip, keeping spine straight. Allow hand on extended leg to reach toward toes. Support upper body with other arm. Hold _30_ seconds. Repeat 2_ times each leg with 5-6 seconds rest between each rep.  Do 1 sessions per day.  Copyright  VHI. All rights reserved.    Bridge    Lying as shown: lift  hips up and hold for 5 seconds. Then lower hips down an hold for 5 seconds.  Repeat _10_ times. Do _1-2_ sessions per day.  http://pm.exer.us/55   Copyright  VHI. All rights reserved.      Strengthening: Hip Abductor - Resisted    Lying flat on bed (not as shown): With yellow band looped around both legs above knees, push thighs apart and hold for 5 seconds. Bring knee back together slowly and hold for 5 seconds.  Repeat _10_ times per set. Do _1_ sets per session. Do _1-2_ sessions per day.  http://orth.exer.us/688   Copyright  VHI. All rights reserved.     Seated in chair or at edge of bed/sofa: slowly lift one foot to straighten knee, hold for 5 seconds, then lower back down. Rest for 5 seconds. Repeat for 10 reps.   Then repeat with other leg. 5 second holds with each re. 5 second rest between each rep.   Perform 1 time a day.      PT Education - 06/24/17 1009    Education provided  Yes    Education Details  article with information for safe exercises with Type 1 Myotonic Dystrophy; intial HEP to address LE/core strengthening and stretching.  Person(s) Educated  Patient;Parent(s)    Methods  Explanation;Demonstration;Verbal cues    Comprehension  Verbalized understanding;Returned demonstration;Need further instruction       PT Short Term Goals - 06/09/17 1805      PT SHORT TERM GOAL #1   Title  Family verbalizes understanding of general guidelines for exercising with myotonic dystrophy. (All STGs Target Date 5th visit)    Baseline  Family are unknowledgeable in exercising guidelines with new diagnosis of myotonic dystrophy    Time  4    Period  Weeks    Status  New    Target Date  07/08/17      PT SHORT TERM GOAL #2   Title  Family demonstrates HEP for stretches to improve flexibility.     Baseline  Family is dependent in proper stretches and she has decreased range in trunk, hamstrings & gastroc.     Time  4    Period  Weeks    Status  New    Target  Date  07/08/17      PT SHORT TERM GOAL #3   Title  Family demonstrates initial HEP to address strength & balance deficits.     Baseline  Family is dependent in exercises to improve strength & balance deficits.    Time  4    Period  Weeks    Status  New    Target Date  07/08/17        PT Long Term Goals - 06/09/17 2011      PT LONG TERM GOAL #1   Title  Family demonstrates understanding of ongoing HEP for flexibility and range. (All LTGs Target Date 13th visit)    Baseline  Family are dependent in exercises with myotonic dystrophy and ROM are impacting balance /falls.     Time  12    Period  Weeks    Status  New    Target Date  09/01/17      PT LONG TERM GOAL #2   Title  Family verbalize and demonstrate ongoing HEP for strength, balance & endurance with Myotonic Dystrophy.     Baseline  Family is dependent in proper exercises with Myotonic Dystrophy.     Time  12    Period  Weeks    Status  New    Target Date  09/01/17      PT LONG TERM GOAL #3   Title  Ankle dorsiflexion PROM >-5* bilateral.     Baseline  Right ankle dorsiflexion -11* and left -18*    Time  12    Period  Weeks    Status  New    Target Date  09/01/17      PT LONG TERM GOAL #4   Title  Berg Balance >25/56 to indicate lower fall risk with standing activities.    Baseline  Merrilee Jansky Balance 19/56    Time  12    Period  Weeks    Status  New    Target Date  09/01/17      PT LONG TERM GOAL #5   Title  Patient ambulates around furniture 30' with supervision.     Baseline  Patient requires minA to negotiate around furniture safely.     Time  12    Period  Weeks    Status  New    Target Date  09/01/17      Additional Long Term Goals   Additional Long Term Goals  Yes  PT LONG TERM GOAL #6   Title  Family demonstrates safe assist with ambulating outdoors on paved & grass surfaces, ramps, curbs & stairs including ability to determine Shalawn's endurance limitations.     Baseline  Family is dependent in  safe techniques for community ambulation with new diagnosis of myotonic dystrophy.     Time  12    Period  Weeks    Status  New    Target Date  09/01/17            Plan - 06/24/17 0939    Clinical Impression Statement  Today's skilled session focused on education to pt/mom on effective exercise with Type 1 myotonic dystrophy with handouts. Remainder of session focused on establishment of an HEP for stretching and strengthening. Pt is progressing toward goals and should benefit from continued PT to progress toward unmet goals.     Rehab Potential  Good    Clinical Impairments Affecting Rehab Potential  impaired cognition    PT Frequency  1x / week    PT Duration  12 weeks    PT Treatment/Interventions  ADLs/Self Care Home Management;DME Instruction;Gait training;Stair training;Functional mobility training;Therapeutic activities;Therapeutic exercise;Balance training;Neuromuscular re-education;Patient/family education;Passive range of motion    PT Next Visit Plan  continue to work on gentle stretching and stregthening, add to HEP as indicated    Consulted and Agree with Plan of Care  Patient       Patient will benefit from skilled therapeutic intervention in order to improve the following deficits and impairments:  Abnormal gait, Decreased activity tolerance, Decreased balance, Decreased cognition, Decreased coordination, Decreased endurance, Decreased knowledge of use of DME, Decreased knowledge of precautions, Decreased mobility, Decreased range of motion, Decreased strength, Impaired flexibility, Postural dysfunction  Visit Diagnosis: Muscle weakness (generalized)  Contracture of muscle, multiple sites     Problem List Patient Active Problem List   Diagnosis Date Noted  . 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 Cyst of right ovary 04/29/2016  . Thickened endometrium 04/29/2016  . Mental impairment 07/14/2014    Willow Ora, PTA, [place] [address] [place], [place] [address] [phone] 06/24/17, 11:34 PM   Name: [name] MRN: [id number] Date of Birth: 1970-10-11

## 2017-06-30 ENCOUNTER — Encounter: Payer: Self-pay | Admitting: Physical Therapy

## 2017-06-30 ENCOUNTER — Ambulatory Visit: Payer: Medicaid Other | Admitting: *Deleted

## 2017-06-30 ENCOUNTER — Encounter: Payer: Self-pay | Admitting: *Deleted

## 2017-06-30 ENCOUNTER — Ambulatory Visit: Payer: Medicaid Other | Admitting: Physical Therapy

## 2017-06-30 DIAGNOSIS — R278 Other lack of coordination: Secondary | ICD-10-CM

## 2017-06-30 DIAGNOSIS — M6281 Muscle weakness (generalized): Secondary | ICD-10-CM | POA: Diagnosis not present

## 2017-06-30 DIAGNOSIS — R279 Unspecified lack of coordination: Secondary | ICD-10-CM

## 2017-06-30 DIAGNOSIS — R29898 Other symptoms and signs involving the musculoskeletal system: Secondary | ICD-10-CM

## 2017-06-30 DIAGNOSIS — R29818 Other symptoms and signs involving the nervous system: Secondary | ICD-10-CM

## 2017-06-30 NOTE — Patient Instructions (Signed)
Access Code: [email or web address]  URL: [email or web address]  Date: 06/30/2017  Prepared by: Jamey Reas   Exercises  Seated Hamstring Stretch - 2 reps - 1 sets - 30 seconds hold - 1x daily - 5x weekly  Seated Gastroc Stretch with Strap - 2 reps - 1 sets - 30 seconds hold - 1x daily - 5x weekly  Seated Long Arc Quad - 10 reps - 1 sets - 2 hold - 1x daily - 5x weekly  Supine Bridge - 10 reps - 1 sets - 5 hold - 1x daily - 5x weekly  Hooklying Clamshell with Resistance - 10 reps - 1 sets - 5 hold - 1x daily - 5x weekly  Supine Knee to Chest with Leg Straight - 10 reps - 1 sets - 5 hold - 1x daily - 5x weekly

## 2017-06-30 NOTE — Therapy (Signed)
[name] [name], [name], [name] Phone: [phone]   Fax:  [phone]  Occupational Therapy Treatment & Discharge Summary  Patient Details  Name: [name] MRN: [id number] Date of Birth: Sep 02, 1970 Referring Provider: Lucienne Capers, MD   Encounter Date: 06/30/2017  OT End of Session - 06/30/17 1103    Visit Number  3    Number of Visits  4    Authorization Type  Medicaid - authorization for Eval + up to 3 additional visits    Authorization - Visit Number  3    Authorization - Number of Visits  4    OT Start Time  1000    OT Stop Time  1048    OT Time Calculation (min)  48 min    Activity Tolerance  Patient tolerated treatment well    Behavior During Therapy  [facility] for tasks assessed/performed       Past Medical History:  Diagnosis Date  . Fibroids   . Mental disorder   . 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 Pneumonia     Past Surgical History:  Procedure Laterality Date  . thumb surgery      There were no vitals filed for this visit.  Subjective Assessment - 06/30/17 1002    Subjective   Pt denies any pain or changes excpet Mother reports that she is now taking 15m aspirin one time/day.     Patient is accompained by:  Family member Mother    Pertinent History  h/o mental impairment; myotonic dystrophy    Currently in Pain?  No/denies    Multiple Pain Sites  No         [facility] OT Assessment - 06/30/17 0001      Coordination   Gross Motor Movements are Fluid and Coordinated  Yes    Box and Blocks  L= 35 R = 30    Coordination  pt and her mother report this is baseline level      Hand Function   Right Hand Grip (lbs)  18    Left Hand Grip (lbs)  22               OT Treatments/Exercises (OP) - 06/30/17 0001      ADLs   ADL Comments  Reviewed ADL goals and energy onservation techniques. Pt was able to state 2-3 energy conservation techniques with her  mother's assistance in clinic today. Discussed laundry, balance of rest/activity and specific ADL's. Reviewed possible options for use in kitchen at home (Ie. electric can opener vs manual/hand held; dycen/grip to assist with opening jars/containers; suggestions for doing laundry and dishes etc). Both pt/Mother verbalized understanding in clinic today.    ADL Education Given  Yes      Exercises   Exercises  Hand;Theraputty      Hand Exercises   Other Hand Exercises  Coordination exercises for bilateral hands. Pt/mother were both instructed in HEP for coordination and performed in clinic for removing pegs and coins from yellow putty as well as picking up coins off table, keeping 5 in hand at one time and placing into container one at a time (pt was noted to drop a penny x3 during this activity with left hand). Discussed doing FM/coordination exercises in lieu of putty ex's if pt has had a busy day/balancing rest and activity secondary to myotonic dystrophy. Both verbalized understanding of this.    Other Hand Exercises  Checked grip and box  and blocks to compare with initial visit.       Theraputty   Theraputty - Grip  Yellow putty bilateral grip 5 reps verbal review to decrease reps w/ increased activity    Theraputty - Pinch  3 point, lateral pinch 5 reps only 1x/day Stressed bal rest/activity. Decrease to avoid fatigue    Theraputty Hand- Locate Pegs  Bilateral hands FM/Coordination to remove pegs from yellow putty:         Coordination Activities  Perform the following activities for 5-10 minutes 1-2 times per day with both hand(s).   Toss ball between hands.  Flip cards 1 at a time as fast as you can.  Pick up coins, buttons, marbles, dried beans/pasta of different sizes and place in container.  Pick up coins and place in container or coin bank.  Pick up coins one at a time until you get 5-10 in your hand, then move coins from palm to fingertips to stack one at a time. Place coins  into putty and use one hand at a time to dig them out.      OT Education - 06/30/17 1052    Education provided  Yes    Education Details  Review Energy Conservation Techniques, HEP and added coordination ex's. Pt/mother both verbalize understanding as demonstrating in clinic today.    Person(s) Educated  Patient;Parent(s) Mother   Mother   Methods  Explanation;Demonstration;Verbal cues;Handout    Comprehension  Verbalized understanding;Returned demonstration       OT Short Term Goals - 06/09/17 1311      OT SHORT TERM GOAL #1   Title  STG's = LTG's        OT Long Term Goals - 06/30/17 1110      OT LONG TERM GOAL #1   Title  Pt/family will be Mod I home program Achieved with family assist and VC's    Time  4    Period  Weeks    Status  Achieved      OT LONG TERM GOAL #2   Title  Pt/family will i'ly state 2-3 energy conservation techniques  Achieved with family assist via Min VC's    Time  4    Period  Weeks    Status  Achieved      OT LONG TERM GOAL #3   Title  Pt/family with verbalize 2-3 possible options for a/e during homemaking/ADL's/in the kitchen etc. Achieved with family PRN assist and VC's    Time  4    Period  Weeks    Status  Achieved            Plan - 06/30/17 1104    Clinical Impression Statement  Pt has met 3/3 goals at htis time related to OT. She requires family assistance/verbal cues but has met goals as stated & observed in clinic today. She should benefit from continued HEP indepdendently/with PRN family assist. They appear to have a good understanding of balance rest and activity in lieu of dx: Myotonic Dystrophy. Plan is to d/c OT at this time and continue with Physical Therapy as planned. Pt/Family are in agreement with this.    Occupational performance deficits (Please refer to evaluation for details):  ADL's;IADL's    Rehab Potential  Good    Current Impairments/barriers affecting progress:  H/o decreased cognition, myotronic dystrophy  since birth per adult sister report.    OT Frequency  Other (comment) Eval + up to 3 additional visits    OT Duration  4 weeks    OT Treatment/Interventions  Self-care/ADL training;Therapeutic exercise;Patient/family education;Energy conservation;Therapeutic activities;DME and/or AE instruction    Plan  D/c to independent home program at this time. Pt will cont with family PRN assistance, stressed balance of activity and rest to avoid fatigue.    Clinical Decision Making  Limited treatment options, no task modification necessary    Consulted and Agree with Plan of Care  Patient;Family member/caregiver    Family Member Consulted  Mother       Patient will benefit from skilled therapeutic intervention in order to improve the following deficits and impairments:  Decreased knowledge of use of DME, Decreased coordination, Decreased activity tolerance, Decreased endurance, Decreased strength, Impaired UE functional use  Visit Diagnosis: Muscle weakness (generalized)  Decreased coordination  Other symptoms and signs involving the musculoskeletal system  Other symptoms and signs involving the nervous system    Problem List Patient Active Problem List   Diagnosis Date Noted  . 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 Cyst of right ovary 04/29/2016  . Thickened endometrium 04/29/2016  . Mental impairment 07/14/2014    OCCUPATIONAL THERAPY DISCHARGE SUMMARY  Visits from Start of Care: Evaluation + 2 additional visits.  Current functional level related to goals / functional outcomes: Pt has met 3/3 occupational therapy goals. She requires vc's from family PRN secondary to cognitive status since birth.   Remaining deficits: Pt is Mod I HEP with PRN family assistance/min vc's.   Education / Equipment: Fm/Coordination/HEP/putty balancing rest/activity to avoid fatigue; Energy conservation strategies; Options for possible A/E PRN. Plan: Patient agrees to discharge.  Patient goals  were met. Patient is being discharged due to meeting the stated rehab goals.  ?????        [address] Herve Haug Beth Dixon, OTR/L 06/30/2017, 11:14 AM  [facility] [address] [place], [name], [address] Phone: [phone]   Fax:  [phone]  Name: [name] MRN: [id number] Date of Birth: 1970-03-02

## 2017-06-30 NOTE — Patient Instructions (Signed)
  Coordination Activities  Perform the following activities for 5-10 minutes 1-2 times per day with both hand(s).   Toss ball between hands.  Flip cards 1 at a time as fast as you can.  Pick up coins, buttons, marbles, dried beans/pasta of different sizes and place in container.  Pick up coins and place in container or coin bank.  Pick up coins one at a time until you get 5-10 in your hand, then move coins from palm to fingertips to stack one at a time.  Place coins into putty and use one hand at a time to dig them out.

## 2017-07-01 NOTE — Therapy (Signed)
[facility] [address] [place], [place], [address] Phone: [phone]   Fax:  [phone]  Physical Therapy Treatment  Patient Details  Name: [name] MRN: [id number] Date of Birth: 03/27/1970 Referring Provider: Lucienne Capers, MD   Encounter Date: 06/30/2017  PT End of Session - 06/30/17 1201    Visit Number  3    Number of Visits  13    Authorization Type  Medicaid    Authorization Time Period  12 visits 06/17/17 - 09/08/17    Authorization - Visit Number  2    Authorization - Number of Visits  12    PT Start Time  1100    PT Stop Time  1144    PT Time Calculation (min)  44 min    Equipment Utilized During Treatment  Gait belt    Activity Tolerance  Patient tolerated treatment well    Behavior During Therapy  [facility] for tasks assessed/performed       Past Medical History:  Diagnosis Date  . Fibroids   . Mental disorder   . 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 Pneumonia     Past Surgical History:  Procedure Laterality Date  . thumb surgery      There were no vitals filed for this visit.  Subjective Assessment - 06/30/17 1104    Subjective  One fall getting into SUV when running board was wet. No injuries.     Patient is accompained by:  Family member    Limitations  Lifting;Standing;Walking;House hold activities    Patient Stated Goals  to reduce falls, strength or maintain muscles    Currently in Pain?  No/denies       Therapeutic Exercise see HEP / pt education                        PT Education - 06/30/17 1200    Education provided  Yes    Education Details  HEP reviewed & updated, exercise guidelines with myotonic dystrophy,     Person(s) Educated  Patient;Parent(s) mother    Methods  Explanation;Demonstration;Tactile cues;Verbal cues;Handout    Comprehension  Verbalized understanding;Returned demonstration;Verbal cues required;Tactile cues required;Need further  instruction       PT Short Term Goals - 06/09/17 1805      PT SHORT TERM GOAL #1   Title  Family verbalizes understanding of general guidelines for exercising with myotonic dystrophy. (All STGs Target Date 5th visit)    Baseline  Family are unknowledgeable in exercising guidelines with new diagnosis of myotonic dystrophy    Time  4    Period  Weeks    Status  New    Target Date  07/08/17      PT SHORT TERM GOAL #2   Title  Family demonstrates HEP for stretches to improve flexibility.     Baseline  Family is dependent in proper stretches and she has decreased range in trunk, hamstrings & gastroc.     Time  4    Period  Weeks    Status  New    Target Date  07/08/17      PT SHORT TERM GOAL #3   Title  Family demonstrates initial HEP to address strength & balance deficits.     Baseline  Family is dependent in exercises to improve strength & balance deficits.    Time  4    Period  Weeks    Status  New    Target Date  07/08/17        PT Long Term Goals - 06/09/17 2011      PT LONG TERM GOAL #1   Title  Family demonstrates understanding of ongoing HEP for flexibility and range. (All LTGs Target Date 13th visit)    Baseline  Family are dependent in exercises with myotonic dystrophy and ROM are impacting balance /falls.     Time  12    Period  Weeks    Status  New    Target Date  09/01/17      PT LONG TERM GOAL #2   Title  Family verbalize and demonstrate ongoing HEP for strength, balance & endurance with Myotonic Dystrophy.     Baseline  Family is dependent in proper exercises with Myotonic Dystrophy.     Time  12    Period  Weeks    Status  New    Target Date  09/01/17      PT LONG TERM GOAL #3   Title  Ankle dorsiflexion PROM >-5* bilateral.     Baseline  Right ankle dorsiflexion -11* and left -18*    Time  12    Period  Weeks    Status  New    Target Date  09/01/17      PT LONG TERM GOAL #4   Title  Berg Balance >25/56 to indicate lower fall risk with standing  activities.    Baseline  Merrilee Jansky Balance 19/56    Time  12    Period  Weeks    Status  New    Target Date  09/01/17      PT LONG TERM GOAL #5   Title  Patient ambulates around furniture 65' with supervision.     Baseline  Patient requires minA to negotiate around furniture safely.     Time  12    Period  Weeks    Status  New    Target Date  09/01/17      Additional Long Term Goals   Additional Long Term Goals  Yes      PT LONG TERM GOAL #6   Title  Family demonstrates safe assist with ambulating outdoors on paved & grass surfaces, ramps, curbs & stairs including ability to determine Tylasia's endurance limitations.     Baseline  Family is dependent in safe techniques for community ambulation with new diagnosis of myotonic dystrophy.     Time  12    Period  Weeks    Status  New    Target Date  09/01/17            Plan - 06/30/17 1202    Clinical Impression Statement  Mother appears to have better understanding of exercise guidelines with Myotonic Dystrophy including need to rest between repetitions and PT recommendation to limit number of exercises at one sitting. PT recommended setting up HEP with ~3 exercises per position or area and working thru the groups so performs each exercise ~5 days/ week.     Rehab Potential  Good    Clinical Impairments Affecting Rehab Potential  impaired cognition    PT Frequency  1x / week    PT Duration  12 weeks    PT Treatment/Interventions  ADLs/Self Care Home Management;DME Instruction;Gait training;Stair training;Functional mobility training;Therapeutic activities;Therapeutic exercise;Balance training;Neuromuscular re-education;Patient/family education;Passive range of motion    PT Next Visit Plan  continue to work on gentle stretching and stregthening, add to HEP standing near counter  Consulted and Agree with Plan of Care  Patient       Patient will benefit from skilled therapeutic intervention in order to improve the following deficits  and impairments:  Abnormal gait, Decreased activity tolerance, Decreased balance, Decreased cognition, Decreased coordination, Decreased endurance, Decreased knowledge of use of DME, Decreased knowledge of precautions, Decreased mobility, Decreased range of motion, Decreased strength, Impaired flexibility, Postural dysfunction  Visit Diagnosis: Muscle weakness (generalized)  Other symptoms and signs involving the musculoskeletal system  Other symptoms and signs involving the nervous system  Decreased coordination     Problem List Patient Active Problem List   Diagnosis Date Noted  . 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 Cyst of right ovary 04/29/2016  . Thickened endometrium 04/29/2016  . Mental impairment 07/14/2014    Jamey Reas PT, DPT 07/01/2017, 12:10 PM  [facility] [address] [place], [place], [address] Phone: [phone]   Fax:  [phone]  Name: NAJEE COWENS MRN: [id number] Date of Birth: 05-Feb-1971

## 2017-07-08 ENCOUNTER — Ambulatory Visit: Payer: Medicaid Other | Admitting: Physical Therapy

## 2017-07-08 ENCOUNTER — Encounter: Payer: Self-pay | Admitting: Physical Therapy

## 2017-07-08 DIAGNOSIS — M6281 Muscle weakness (generalized): Secondary | ICD-10-CM | POA: Diagnosis not present

## 2017-07-08 DIAGNOSIS — R29898 Other symptoms and signs involving the musculoskeletal system: Secondary | ICD-10-CM

## 2017-07-08 DIAGNOSIS — R29818 Other symptoms and signs involving the nervous system: Secondary | ICD-10-CM

## 2017-07-08 DIAGNOSIS — R279 Unspecified lack of coordination: Secondary | ICD-10-CM

## 2017-07-08 DIAGNOSIS — R278 Other lack of coordination: Secondary | ICD-10-CM

## 2017-07-08 NOTE — Therapy (Signed)
[facility] [address] [place], [place], [address] Phone: [phone]   Fax:  [phone]  Physical Therapy Treatment  Patient Details  Name: [name] MRN: [id number] Date of Birth: Dec 22, 1970 Referring Provider: Lucienne Capers, MD   Encounter Date: 07/08/2017  PT End of Session - 07/08/17 1109    Visit Number  4    Number of Visits  13    Authorization Type  Medicaid    Authorization Time Period  12 visits 06/17/17 - 09/08/17    Authorization - Visit Number  3    Authorization - Number of Visits  12    PT Start Time  1103    PT Stop Time  1142    PT Time Calculation (min)  39 min    Equipment Utilized During Treatment  Gait belt    Activity Tolerance  Patient tolerated treatment well    Behavior During Therapy  WFL for tasks assessed/performed       Past Medical History:  Diagnosis Date  . Fibroids   . Mental disorder   . 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 Pneumonia     Past Surgical History:  Procedure Laterality Date  . thumb surgery      There were no vitals filed for this visit.  Subjective Assessment - 07/08/17 1105    Subjective  Had a couple of falls since last session. One was over the trunk at the foot of her bed. Denied any injuries. The other [place] the park. Denied any injuries. Fell on her knees both times. At home was able to get herself up with UE support on trunk. At the park had help from her sister and about 4 other people.     Limitations  Lifting;Standing;Walking;House hold activities    Patient Stated Goals  to reduce falls, strength or maintain muscles    Currently in Pain?  No/denies           [facility] Adult PT Treatment/Exercise - 07/08/17 1111      Knee/Hip Exercises: Aerobic   Other Aerobic  Scifit with UE/LE's on level 1.0 for 5 minutes, rest 5 minutes, then another 5 mintues. cues needed on posture and ex form through out. pt's heels do not rest  on foot plate due to LE tightness as well.        Knee/Hip Exercises: Supine   Short Arc Quad Sets  AROM;Strengthening;Both;1 set;10 reps;Limitations    Short Arc Quad Sets Limitations  5 sec holds, 5 sec rest between each rep. min AA needed at times to slow motions down    [employer]  AROM;Strengthening;Both;1 set;10 reps;Limitations    Bridges Limitations  5 sec holds with 5 sec rest break between each rep    Straight Leg Raises  AROM;Strengthening;Both;1 set;10 reps;Limitations    Straight Leg Raises Limitations  assist for slow, controlled movements with 5 sec rest break between each rep      Knee/Hip Exercises: Prone   Hamstring Curl  1 set;10 reps;Limitations    Hamstring Curl Limitations  with yellow band resistance, cues/assist for controlled return after each rep, 5 sec rest between each rep.            PT Short Term Goals - 06/09/17 1805      PT SHORT TERM GOAL #1   Title  Family verbalizes understanding of general guidelines for exercising with myotonic dystrophy. (All STGs Target Date 5th visit)  Baseline  Family are unknowledgeable in exercising guidelines with new diagnosis of myotonic dystrophy    Time  4    Period  Weeks    Status  New    Target Date  07/08/17      PT SHORT TERM GOAL #2   Title  Family demonstrates HEP for stretches to improve flexibility.     Baseline  Family is dependent in proper stretches and she has decreased range in trunk, hamstrings & gastroc.     Time  4    Period  Weeks    Status  New    Target Date  07/08/17      PT SHORT TERM GOAL #3   Title  Family demonstrates initial HEP to address strength & balance deficits.     Baseline  Family is dependent in exercises to improve strength & balance deficits.    Time  4    Period  Weeks    Status  New    Target Date  07/08/17        PT Long Term Goals - 06/09/17 2011      PT LONG TERM GOAL #1   Title  Family demonstrates understanding of ongoing HEP for flexibility and range. (All  LTGs Target Date 13th visit)    Baseline  Family are dependent in exercises with myotonic dystrophy and ROM are impacting balance /falls.     Time  12    Period  Weeks    Status  New    Target Date  09/01/17      PT LONG TERM GOAL #2   Title  Family verbalize and demonstrate ongoing HEP for strength, balance & endurance with Myotonic Dystrophy.     Baseline  Family is dependent in proper exercises with Myotonic Dystrophy.     Time  12    Period  Weeks    Status  New    Target Date  09/01/17      PT LONG TERM GOAL #3   Title  Ankle dorsiflexion PROM >-5* bilateral.     Baseline  Right ankle dorsiflexion -11* and left -18*    Time  12    Period  Weeks    Status  New    Target Date  09/01/17      PT LONG TERM GOAL #4   Title  Berg Balance >25/56 to indicate lower fall risk with standing activities.    Baseline  Merrilee Jansky Balance 19/56    Time  12    Period  Weeks    Status  New    Target Date  09/01/17      PT LONG TERM GOAL #5   Title  Patient ambulates around furniture 54' with supervision.     Baseline  Patient requires minA to negotiate around furniture safely.     Time  12    Period  Weeks    Status  New    Target Date  09/01/17      Additional Long Term Goals   Additional Long Term Goals  Yes      PT LONG TERM GOAL #6   Title  Family demonstrates safe assist with ambulating outdoors on paved & grass surfaces, ramps, curbs & stairs including ability to determine Kyandra's endurance limitations.     Baseline  Family is dependent in safe techniques for community ambulation with new diagnosis of myotonic dystrophy.     Time  12    Period  Weeks  Status  New    Target Date  09/01/17          Plan - 07/08/17 1109    Clinical Impression Statement  Today's skilled session continued to focus on gentle strengthening with rest breaks between each rep. No issues noted or reported in session today. Pt is progressing toward goals and should benefit from continued PT to  progress toward unmet goals.        Rehab Potential  Good    Clinical Impairments Affecting Rehab Potential  impaired cognition    PT Frequency  1x / week    PT Duration  12 weeks    PT Treatment/Interventions  ADLs/Self Care Home Management;DME Instruction;Gait training;Stair training;Functional mobility training;Therapeutic activities;Therapeutic exercise;Balance training;Neuromuscular re-education;Patient/family education;Passive range of motion    PT Next Visit Plan  continue to work on gentle stretching and stregthening, add to HEP standing near counter    Consulted and Agree with Plan of Care  Patient       Patient will benefit from skilled therapeutic intervention in order to improve the following deficits and impairments:  Abnormal gait, Decreased activity tolerance, Decreased balance, Decreased cognition, Decreased coordination, Decreased endurance, Decreased knowledge of use of DME, Decreased knowledge of precautions, Decreased mobility, Decreased range of motion, Decreased strength, Impaired flexibility, Postural dysfunction  Visit Diagnosis: Muscle weakness (generalized)  Other symptoms and signs involving the musculoskeletal system  Other symptoms and signs involving the nervous system  Decreased coordination     Problem List Patient Active Problem List   Diagnosis Date Noted  . 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 Cyst of right ovary 04/29/2016  . Thickened endometrium 04/29/2016  . Mental impairment 07/14/2014    Willow Ora, PTA, [facility] [address] [place], [place] [address] [phone] 07/08/17, 10:05 PM  Name: [name] MRN: [id number] Date of Birth: [birth year]

## 2017-07-14 ENCOUNTER — Ambulatory Visit: Payer: Medicaid Other | Admitting: Obstetrics

## 2017-07-15 ENCOUNTER — Ambulatory Visit: Payer: Medicaid Other | Admitting: Physical Therapy

## 2017-07-16 ENCOUNTER — Ambulatory Visit: Payer: Medicaid Other | Admitting: Obstetrics and Gynecology

## 2017-07-21 ENCOUNTER — Ambulatory Visit: Payer: Medicaid Other | Attending: Neurology | Admitting: Physical Therapy

## 2017-07-21 DIAGNOSIS — R279 Unspecified lack of coordination: Secondary | ICD-10-CM | POA: Insufficient documentation

## 2017-07-21 DIAGNOSIS — R29818 Other symptoms and signs involving the nervous system: Secondary | ICD-10-CM | POA: Insufficient documentation

## 2017-07-21 DIAGNOSIS — R29898 Other symptoms and signs involving the musculoskeletal system: Secondary | ICD-10-CM | POA: Insufficient documentation

## 2017-07-21 DIAGNOSIS — R2681 Unsteadiness on feet: Secondary | ICD-10-CM | POA: Insufficient documentation

## 2017-07-21 DIAGNOSIS — M6281 Muscle weakness (generalized): Secondary | ICD-10-CM | POA: Insufficient documentation

## 2017-07-28 ENCOUNTER — Telehealth: Payer: Self-pay | Admitting: Physical Therapy

## 2017-07-28 ENCOUNTER — Encounter: Payer: Self-pay | Admitting: Physical Therapy

## 2017-07-28 ENCOUNTER — Ambulatory Visit: Payer: Medicaid Other | Admitting: Physical Therapy

## 2017-07-28 DIAGNOSIS — M6281 Muscle weakness (generalized): Secondary | ICD-10-CM

## 2017-07-28 DIAGNOSIS — M21371 Foot drop, right foot: Secondary | ICD-10-CM

## 2017-07-28 DIAGNOSIS — R29898 Other symptoms and signs involving the musculoskeletal system: Secondary | ICD-10-CM

## 2017-07-28 DIAGNOSIS — R278 Other lack of coordination: Secondary | ICD-10-CM

## 2017-07-28 DIAGNOSIS — R29818 Other symptoms and signs involving the nervous system: Secondary | ICD-10-CM | POA: Diagnosis present

## 2017-07-28 DIAGNOSIS — G7111 Myotonic muscular dystrophy: Secondary | ICD-10-CM

## 2017-07-28 DIAGNOSIS — R279 Unspecified lack of coordination: Secondary | ICD-10-CM

## 2017-07-28 DIAGNOSIS — R2681 Unsteadiness on feet: Secondary | ICD-10-CM | POA: Diagnosis present

## 2017-07-28 DIAGNOSIS — M21372 Foot drop, left foot: Principal | ICD-10-CM

## 2017-07-28 NOTE — Telephone Encounter (Signed)
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  They will benefit from use of Bilateral AFOs in order to improve safety with functional mobility.    If you agree, please submit request in EPIC under referral for DME (list Bilateral AFOs in comments) or fax to [facility] at [phone].   Thank you, Jamey Reas, PT, [facility] [address] [place], [place]  [address] Phone:  [phone] Fax:  [phone]

## 2017-07-28 NOTE — Patient Instructions (Signed)
Access Code: [email or web address]  URL: [email or web address]  Date: 07/28/2017  Prepared by: Jamey Reas   Exercises Seated Hamstring Stretch - 2 reps - 1 sets - 30 seconds hold - 1x daily - 5x weekly Seated Gastroc Stretch with Strap - 2 reps - 1 sets - 30 seconds hold - 1x daily - 5x weekly Seated Long Arc Quad - 10 reps - 1 sets - 2 hold - 1x daily - 3x weekly Supine Bridge - 10 reps - 1 sets - 5 hold - 1x daily - 3x weekly Hooklying Clamshell with Resistance - 10 reps - 1 sets - 5 hold - 1x daily - 3x weekly Supine Knee to Chest with Leg Straight - 10 reps - 1 sets - 5 hold - 1x daily - 3x weekly Seated Lean Back on Swiss Ball - 5-10 reps - 1 sets - 2 hold - 1x daily - 3x weekly Seated Flexion Stretch with Swiss Ball - 5-10 reps - 1 sets - 2 hold                            - 1x daily - 3x weekly Seated Trunk Rotation - 5-10 reps - 1 sets - 2 hold - 1x daily - 3x weekly Seated Sidebending - 5-10 reps - 1 sets - 2 hold - 1x daily - 3x weekly

## 2017-07-28 NOTE — Telephone Encounter (Signed)
Order placed for DME b/l AFOs. Please fax to [facility] PT

## 2017-07-28 NOTE — Telephone Encounter (Signed)
Order faxed to [facility]. Received a receipt of confirmation.

## 2017-07-29 NOTE — Therapy (Signed)
[facility] [address] [place], [place], [address] Phone: [phone]   Fax:  [phone]  Physical Therapy Treatment  Patient Details  Name: [name] MRN: [id number] Date of Birth: Feb 04, 1971 Referring Provider: Lucienne Capers, MD   Encounter Date: 07/28/2017  PT End of Session - 07/28/17 1212    Visit Number  5    Number of Visits  13    Authorization Type  Medicaid    Authorization Time Period  12 visits 06/17/17 - 09/08/17    Authorization - Visit Number  4    Authorization - Number of Visits  12    PT Start Time  2841    PT Stop Time  1150    PT Time Calculation (min)  45 min    Equipment Utilized During Treatment  Gait belt    Activity Tolerance  Patient tolerated treatment well    Behavior During Therapy  WFL for tasks assessed/performed       Past Medical History:  Diagnosis Date  . Fibroids   . Mental disorder   . 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 Pneumonia     Past Surgical History:  Procedure Laterality Date  . thumb surgery      There were no vitals filed for this visit.  Subjective Assessment - 07/28/17 1105    Subjective  She has not been able to do much of exercises due to mother having medical appointments.     Limitations  Lifting;Standing;Walking;House hold activities    Patient Stated Goals  to reduce falls, strength or maintain muscles    Currently in Pain?  No/denies       Gait Training / Orthotic assessment - training Pt ambulated from lobby to gym without device with steppage gait with bilateral foot drop with supervision.  PT instructed mother & patient in balance, safety & energy issues with steppage gait & benefits to using orthotics to assist foot drop.  PT donned right initially then bilateral [place] dynamic response carbon AFOs. Gait with right AFO required minA for first 10' then supervision for 75'. Improved RLE clearance.  Gait with bil. AFOs with min  guard initially then supervision 26' with improved clearance & stability. Mother verbalized noticed improvement in gait with AFOs. Stairs with 2 rails with bil. AFOs forward ascending & 1/4 turn sideways descending with supervision & improved clearance & safety. PT instructed in need for lace up shoes with deeper toe box to use AFOs. PT discussed consideration to weight also with her diagnosis.  Mother to bring 2 sets of shoes to next session & PT to set-up orthotic consult.  Therapeutic exercise: PT reviewed previous exercises using computer with mother who reports understanding.  PT recommended dividing exercises by area: bed exercises, chair exercises, bar stool, etc. Goal would be to do ~3 exercises per area, perform one set M, W, F morning, different set M, W, F afternoon, Tu, TH, Sat morning & afternoon. Mother verbalized understanding.  PT instructed with verbal cues & demo while patient performed: sitting on 24" bar stool without arm or back support. Forward lean /trunk flexion with back extension to sit upright, backwards lean & recovery to upright, trunk rotation right & left, side bend right & left and sit to stand without UE assist. 5 reps ea with 2-3 sec rest between reps.  PT instructed & demo safe set-up to exercise with 65cm Swiss ball mat table (couch at home) behind her & chair on  each side. Pelvic rolls anterior/posterior & right/left laterally. UE movement with simulated "dance moves". Alternate hip flexion / marching, alternate toe taps & alternate heel taps.  other to consider if Swiss ball or bar stool are option for exercises at home.                        PT Education - 07/28/17 1213    Education provided  Yes    Education Details  reviewed HEP Aeronautical engineer, Seated Gastroc Stretch with Strap, Seated Long Arc Quad, Supine Bridge, Hooklying Clamshell with Resistance, Supine Knee to Chest with Leg Straight)   added seated on 24" bar stool (Seated  Lean Back, Seated Flexion, Seated Trunk Rotation, Seated Sidebending)    set-up for exercising with Swiss Ball (65cm)    AFOs    Person(s) Educated  Parent(s);Patient    Methods  Explanation;Demonstration;Tactile cues;Verbal cues;Handout    Comprehension  Verbalized understanding;Returned demonstration;Tactile cues required;Verbal cues required;Need further instruction       PT Short Term Goals - 07/28/17 1753      PT SHORT TERM GOAL #1   Title  Family verbalizes understanding of general guidelines for exercising with myotonic dystrophy. (All STGs Target Date 5th visit)    Baseline  MET 07/28/2017     Time  4    Period  Weeks    Status  Achieved      PT SHORT TERM GOAL #2   Title  Family demonstrates HEP for stretches to improve flexibility.     Baseline  MET 07/28/2017    Time  4    Period  Weeks    Status  Achieved      PT SHORT TERM GOAL #3   Title  Family demonstrates initial HEP to address strength & balance deficits.     Baseline  MET for exercises instructed on 07/28/2017      Time  4    Period  Weeks    Status  Achieved        PT Long Term Goals - 06/09/17 2011      PT LONG TERM GOAL #1   Title  Family demonstrates understanding of ongoing HEP for flexibility and range. (All LTGs Target Date 13th visit)    Baseline  Family are dependent in exercises with myotonic dystrophy and ROM are impacting balance /falls.     Time  12    Period  Weeks    Status  New    Target Date  09/01/17      PT LONG TERM GOAL #2   Title  Family verbalize and demonstrate ongoing HEP for strength, balance & endurance with Myotonic Dystrophy.     Baseline  Family is dependent in proper exercises with Myotonic Dystrophy.     Time  12    Period  Weeks    Status  New    Target Date  09/01/17      PT LONG TERM GOAL #3   Title  Ankle dorsiflexion PROM >-5* bilateral.     Baseline  Right ankle dorsiflexion -11* and left -18*    Time  12    Period  Weeks    Status  New    Target Date   09/01/17      PT LONG TERM GOAL #4   Title  Berg Balance >25/56 to indicate lower fall risk with standing activities.    Baseline  Merrilee Jansky Balance 19/56    Time  12    Period  Weeks    Status  New    Target Date  09/01/17      PT LONG TERM GOAL #5   Title  Patient ambulates around furniture 77' with supervision.     Baseline  Patient requires minA to negotiate around furniture safely.     Time  12    Period  Weeks    Status  New    Target Date  09/01/17      Additional Long Term Goals   Additional Long Term Goals  Yes      PT LONG TERM GOAL #6   Title  Family demonstrates safe assist with ambulating outdoors on paved & grass surfaces, ramps, curbs & stairs including ability to determine Damon's endurance limitations.     Baseline  Family is dependent in safe techniques for community ambulation with new diagnosis of myotonic dystrophy.     Time  12    Period  Weeks    Status  New    Target Date  09/01/17            Plan - 07/28/17 1755    Clinical Impression Statement  Patient met 3 STGs today. Patient's gait was greatly improved with bilateral AFOs to address foot drop. She would benefit from orthotic consult to determine which design would be optimal for her deficits. Weight of AFOs & shoes along with control are primary concerns.     Rehab Potential  Good    Clinical Impairments Affecting Rehab Potential  impaired cognition    PT Frequency  1x / week    PT Duration  12 weeks    PT Treatment/Interventions  ADLs/Self Care Home Management;DME Instruction;Gait training;Stair training;Functional mobility training;Therapeutic activities;Therapeutic exercise;Balance training;Neuromuscular re-education;Patient/family education;Passive range of motion    PT Next Visit Plan  continue to work on gentle stretching and stregthening, add to HEP standing near counter    Recommended Other Services  PT setting up orthotic consult called [facility] 07/28/2017 & requested Rx from MD    Consulted  and Agree with Plan of Care  Patient       Patient will benefit from skilled therapeutic intervention in order to improve the following deficits and impairments:  Abnormal gait, Decreased activity tolerance, Decreased balance, Decreased cognition, Decreased coordination, Decreased endurance, Decreased knowledge of use of DME, Decreased knowledge of precautions, Decreased mobility, Decreased range of motion, Decreased strength, Impaired flexibility, Postural dysfunction  Visit Diagnosis: Muscle weakness (generalized)  Other symptoms and signs involving the musculoskeletal system  Other symptoms and signs involving the nervous system  Decreased coordination     Problem List Patient Active Problem List   Diagnosis Date Noted  . 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 Cyst of right ovary 04/29/2016  . Thickened endometrium 04/29/2016  . Mental impairment 07/14/2014    Jamey Reas  PT, DPT 07/29/2017, 7:59 AM  [facility] [address] [place], [place], [address] Phone: [phone]   Fax:  [phone]  Name: [name] MRN: [id number] Date of Birth: Mar 21, 1970

## 2017-08-04 ENCOUNTER — Ambulatory Visit: Payer: Medicaid Other | Admitting: Physical Therapy

## 2017-08-04 ENCOUNTER — Encounter: Payer: Self-pay | Admitting: Physical Therapy

## 2017-08-04 DIAGNOSIS — M6281 Muscle weakness (generalized): Secondary | ICD-10-CM | POA: Diagnosis not present

## 2017-08-04 DIAGNOSIS — R29898 Other symptoms and signs involving the musculoskeletal system: Secondary | ICD-10-CM

## 2017-08-04 DIAGNOSIS — R2681 Unsteadiness on feet: Secondary | ICD-10-CM

## 2017-08-04 DIAGNOSIS — R29818 Other symptoms and signs involving the nervous system: Secondary | ICD-10-CM

## 2017-08-05 NOTE — Therapy (Signed)
[facility] [address] [place], [place], [address] Phone: [phone]   Fax:  [phone]  Physical Therapy Treatment  Patient Details  Name: [name] MRN: [id number] Date of Birth: 1970-10-28 Referring Provider: Lucienne Capers, MD   Encounter Date: 08/04/2017  PT End of Session - 08/04/17 1109    Visit Number  6    Number of Visits  13    Authorization Type  Medicaid    Authorization Time Period  12 visits 06/17/17 - 09/08/17    Authorization - Visit Number  5    Authorization - Number of Visits  12    PT Start Time  0388    PT Stop Time  1143    PT Time Calculation (min)  40 min    Equipment Utilized During Treatment  Gait belt    Activity Tolerance  Patient tolerated treatment well    Behavior During Therapy  WFL for tasks assessed/performed       Past Medical History:  Diagnosis Date  . Fibroids   . Mental disorder   . 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 Pneumonia     Past Surgical History:  Procedure Laterality Date  . thumb surgery      There were no vitals filed for this visit.  Subjective Assessment - 08/04/17 1107    Subjective  No new complaints. Did have a fall on Saturday am. Pt got up off bed fast and went to take off. Lost balance and went down before mom got to her. Was able to get herself up. Denies any injuries. No pain today.     Patient is accompained by:  Family member    Limitations  Lifting;Standing;Walking;House hold activities    Patient Stated Goals  to reduce falls, strength or maintain muscles    Currently in Pain?  No/denies          [facility] Adult PT Treatment/Exercise - 08/04/17 1110      Exercises   Exercises  Other Exercises    Other Exercises   seated on green airdisc with feet on 4 inch box: worked on obtaining tall/midline posture, then worked on the following: pelvic rocking laterally, then ant/posteriorly with mod verbal/tactile cues with facilitation.  Progressed to alternating UE raises with 1# hand weights x 10 each side with rest between each rep, scapular squeezes with 1# hand weights x 10 reps with rest between each rep; alternating marching with UE support on mat x 10 reps each side with rest between reps, Long arc quads x 10 reps each side with rest between reps. Cues on posture thought out with min guard assist for safety/balance.      Knee/Hip Exercises: Seated   Hamstring Curl  Strengthening;Both;1 set;10 reps;Limitations    Hamstring Limitations  with yellow band resistance- 5 sec holds, 5 sec rest      Knee/Hip Exercises: Supine   Short Arc Quad Sets  AROM;Strengthening;Both;1 set;10 reps;Limitations    Short Arc Quad Sets Limitations  5 sec holds, 5 sec rest between each rep. cues for slow, controlled lowering each rep. AA needed for full knee extension with last 3 reps on right side.     Bridges  AROM;Strengthening;Both;1 set;10 reps;Limitations    Bridges Limitations  5 sec holds with 5 sec rest break between each rep          PT Short Term Goals - 07/28/17 1753      PT SHORT TERM GOAL #1  Title  Family verbalizes understanding of general guidelines for exercising with myotonic dystrophy. (All STGs Target Date 5th visit)    Baseline  MET 07/28/2017     Time  4    Period  Weeks    Status  Achieved      PT SHORT TERM GOAL #2   Title  Family demonstrates HEP for stretches to improve flexibility.     Baseline  MET 07/28/2017    Time  4    Period  Weeks    Status  Achieved      PT SHORT TERM GOAL #3   Title  Family demonstrates initial HEP to address strength & balance deficits.     Baseline  MET for exercises instructed on 07/28/2017      Time  4    Period  Weeks    Status  Achieved        PT Long Term Goals - 06/09/17 2011      PT LONG TERM GOAL #1   Title  Family demonstrates understanding of ongoing HEP for flexibility and range. (All LTGs Target Date 13th visit)    Baseline  Family are dependent in  exercises with myotonic dystrophy and ROM are impacting balance /falls.     Time  12    Period  Weeks    Status  New    Target Date  09/01/17      PT LONG TERM GOAL #2   Title  Family verbalize and demonstrate ongoing HEP for strength, balance & endurance with Myotonic Dystrophy.     Baseline  Family is dependent in proper exercises with Myotonic Dystrophy.     Time  12    Period  Weeks    Status  New    Target Date  09/01/17      PT LONG TERM GOAL #3   Title  Ankle dorsiflexion PROM >-5* bilateral.     Baseline  Right ankle dorsiflexion -11* and left -18*    Time  12    Period  Weeks    Status  New    Target Date  09/01/17      PT LONG TERM GOAL #4   Title  Berg Balance >25/56 to indicate lower fall risk with standing activities.    Baseline  Merrilee Jansky Balance 19/56    Time  12    Period  Weeks    Status  New    Target Date  09/01/17      PT LONG TERM GOAL #5   Title  Patient ambulates around furniture 88' with supervision.     Baseline  Patient requires minA to negotiate around furniture safely.     Time  12    Period  Weeks    Status  New    Target Date  09/01/17      Additional Long Term Goals   Additional Long Term Goals  Yes      PT LONG TERM GOAL #6   Title  Family demonstrates safe assist with ambulating outdoors on paved & grass surfaces, ramps, curbs & stairs including ability to determine [name]'s endurance limitations.     Baseline  Family is dependent in safe techniques for community ambulation with new diagnosis of myotonic dystrophy.     Time  12    Period  Weeks    Status  New    Target Date  09/01/17         08/04/17 1109  Plan  Clinical Impression  Statement Today's skilled session continued to focus on gentle strengthening of LE's, core and UE's with no issues noted or reported in session. Pt is progressing toward goals and should benefit from continued PT to progress toward unmet goals.   Pt will benefit from skilled therapeutic intervention in  order to improve on the following deficits Abnormal gait;Decreased activity tolerance;Decreased balance;Decreased cognition;Decreased coordination;Decreased endurance;Decreased knowledge of use of DME;Decreased knowledge of precautions;Decreased mobility;Decreased range of motion;Decreased strength;Impaired flexibility;Postural dysfunction  Rehab Potential Good  Clinical Impairments Affecting Rehab Potential impaired cognition  PT Frequency 1x / week  PT Duration 12 weeks  PT Treatment/Interventions ADLs/Self Care Home Management;DME Instruction;Gait training;Stair training;Functional mobility training;Therapeutic activities;Therapeutic exercise;Balance training;Neuromuscular re-education;Patient/family education;Passive range of motion  PT Next Visit Plan orthotic consult scheduled for next session  Consulted and Agree with Plan of Care Patient       Patient will benefit from skilled therapeutic intervention in order to improve the following deficits and impairments:  Abnormal gait, Decreased activity tolerance, Decreased balance, Decreased cognition, Decreased coordination, Decreased endurance, Decreased knowledge of use of DME, Decreased knowledge of precautions, Decreased mobility, Decreased range of motion, Decreased strength, Impaired flexibility, Postural dysfunction  Visit Diagnosis: Muscle weakness (generalized)  Other symptoms and signs involving the musculoskeletal system  Other symptoms and signs involving the nervous system  Unsteadiness on feet     Problem List Patient Active Problem List   Diagnosis Date Noted  . 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 Cyst of right ovary 04/29/2016  . Thickened endometrium 04/29/2016  . Mental impairment 07/14/2014    Willow Ora, PTA, [facility] [address] [place], [place] [address] [phone] 08/05/17, 1:29 PM   Name: ADDALYNNE GOLDING MRN: [id number] Date of Birth: 05/16/1970

## 2017-08-11 ENCOUNTER — Encounter: Payer: Self-pay | Admitting: Physical Therapy

## 2017-08-11 ENCOUNTER — Ambulatory Visit: Payer: Medicaid Other | Attending: Neurology | Admitting: Physical Therapy

## 2017-08-11 DIAGNOSIS — M21371 Foot drop, right foot: Secondary | ICD-10-CM | POA: Diagnosis present

## 2017-08-11 DIAGNOSIS — M6281 Muscle weakness (generalized): Secondary | ICD-10-CM

## 2017-08-11 DIAGNOSIS — R29898 Other symptoms and signs involving the musculoskeletal system: Secondary | ICD-10-CM

## 2017-08-11 DIAGNOSIS — R2681 Unsteadiness on feet: Secondary | ICD-10-CM | POA: Insufficient documentation

## 2017-08-11 DIAGNOSIS — R29818 Other symptoms and signs involving the nervous system: Secondary | ICD-10-CM | POA: Diagnosis present

## 2017-08-11 DIAGNOSIS — M21372 Foot drop, left foot: Secondary | ICD-10-CM | POA: Diagnosis present

## 2017-08-12 ENCOUNTER — Telehealth: Payer: Self-pay | Admitting: Physical Therapy

## 2017-08-12 DIAGNOSIS — G7100 Muscular dystrophy, unspecified: Secondary | ICD-10-CM

## 2017-08-12 NOTE — Telephone Encounter (Signed)
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  They will benefit from use of bilateral posterior leaf spring AFOs and bilateral custom foot orthoses in order to improve safety with functional mobility.    If you agree, please submit request in EPIC under referral for DME (list bilateral posterior leaf spring AFOs and bilateral custom foot orthoses in comments) or fax to [facility] at [phone].    Thank you, Jamey Reas, PT, [facility] [address] [place], [place]  [address] Phone:  [phone] Fax:  [phone]

## 2017-08-12 NOTE — Telephone Encounter (Signed)
Dr. Rexene Alberts is out of the office until 08/17/2017. Thanks Hinton Dyer

## 2017-08-12 NOTE — Therapy (Signed)
[facility] [address] [place], [place], [address] Phone: [phone]   Fax:  [phone]  Physical Therapy Treatment  Patient Details  Name: [name] MRN: [id number] Date of Birth: [birth year] Referring Provider: Lucienne Capers, MD   Encounter Date: 08/11/2017  PT End of Session - 08/11/17 1212    Visit Number  7    Number of Visits  13    Authorization Type  Medicaid    Authorization Time Period  12 visits 06/17/17 - 09/08/17    Authorization - Visit Number  6    Authorization - Number of Visits  12    PT Start Time  1100    PT Stop Time  1150    PT Time Calculation (min)  50 min    Equipment Utilized During Treatment  Gait belt    Activity Tolerance  Patient tolerated treatment well    Behavior During Therapy  WFL for tasks assessed/performed       Past Medical History:  Diagnosis Date  . Fibroids   . Mental disorder   . 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 Pneumonia     Past Surgical History:  Procedure Laterality Date  . thumb surgery      There were no vitals filed for this visit.  Subjective Assessment - 08/11/17 1100    Subjective  No falls. Her exercises are going well.     Patient is accompained by:  Family member    Limitations  Lifting;Standing;Walking;House hold activities    Patient Stated Goals  to reduce falls, strength or maintain muscles    Currently in Pain?  No/denies       Orthotic Consult with Willette Alma, CPO and Jamey Reas, PT PT donned bilateral posterior leaf spring AFO's. Patient needs AFOs with lighter weight with her disability to avoid muscle weakness / fatigue. Pt ambulated 45' X 3 with PT & CPO assessing gait. Patient's feet / ankle pronate causing tibia rotation, knee adduction with flexed hips/ trunk. She needs custom foot orthoses to prevent overpronation. Her knees hyperextend to compensate for weakness but both PT & CPO felt unlocking / stopping  hyperextension could result in knee buckling or fall. So AFOs will not prevent her hyperextension.  PT instructed patient and mother in need for building wear once she gets AFOs / custom foot orthosis to all awake hours and risk of falls once her body changed movement patterns to not have to compensate for foot drop which may result in falls. Mother verbalized understanding.   Therapeutic Exercise: Sitting on 65cm swiss therapeutic ball, PT instructed in safe set-up in front of mat table / couch at home with chair to right & left for intermittent touch / support. Rolling ball ant/post, medial/lateral, circles; alternate overhead reach, alternate stepping forward, alternate forward reach, "dancing" with upper extremities & trunk motion.                          PT Short Term Goals - 07/28/17 1753      PT SHORT TERM GOAL #1   Title  Family verbalizes understanding of general guidelines for exercising with myotonic dystrophy. (All STGs Target Date 5th visit)    Baseline  MET 07/28/2017     Time  4    Period  Weeks    Status  Achieved      PT SHORT TERM GOAL #2   Title  Family demonstrates HEP  for stretches to improve flexibility.     Baseline  MET 07/28/2017    Time  4    Period  Weeks    Status  Achieved      PT SHORT TERM GOAL #3   Title  Family demonstrates initial HEP to address strength & balance deficits.     Baseline  MET for exercises instructed on 07/28/2017      Time  4    Period  Weeks    Status  Achieved        PT Long Term Goals - 06/09/17 2011      PT LONG TERM GOAL #1   Title  Family demonstrates understanding of ongoing HEP for flexibility and range. (All LTGs Target Date 13th visit)    Baseline  Family are dependent in exercises with myotonic dystrophy and ROM are impacting balance /falls.     Time  12    Period  Weeks    Status  New    Target Date  09/01/17      PT LONG TERM GOAL #2   Title  Family verbalize and demonstrate ongoing HEP for  strength, balance & endurance with Myotonic Dystrophy.     Baseline  Family is dependent in proper exercises with Myotonic Dystrophy.     Time  12    Period  Weeks    Status  New    Target Date  09/01/17      PT LONG TERM GOAL #3   Title  Ankle dorsiflexion PROM >-5* bilateral.     Baseline  Right ankle dorsiflexion -11* and left -18*    Time  12    Period  Weeks    Status  New    Target Date  09/01/17      PT LONG TERM GOAL #4   Title  Berg Balance >25/56 to indicate lower fall risk with standing activities.    Baseline  Merrilee Jansky Balance 19/56    Time  12    Period  Weeks    Status  New    Target Date  09/01/17      PT LONG TERM GOAL #5   Title  Patient ambulates around furniture 12' with supervision.     Baseline  Patient requires minA to negotiate around furniture safely.     Time  12    Period  Weeks    Status  New    Target Date  09/01/17      Additional Long Term Goals   Additional Long Term Goals  Yes      PT LONG TERM GOAL #6   Title  Family demonstrates safe assist with ambulating outdoors on paved & grass surfaces, ramps, curbs & stairs including ability to determine Coreen's endurance limitations.     Baseline  Family is dependent in safe techniques for community ambulation with new diagnosis of myotonic dystrophy.     Time  12    Period  Weeks    Status  New    Target Date  09/01/17            Plan - 08/12/17 1253    Clinical Impression Statement  Orthotist, Willette Alma, [facility] present today & consult with PT indicates that patient would benefit from bilateral posterior leaf spring dynamic response to keep weight lower and bilateral custom foot orthosis to minimize the overpronation that results in closed chain kinetic lower extremity / trunk reactions. AFO's significantly improve clearance of bilateral lower extremities.  Rehab Potential  Good    Clinical Impairments Affecting Rehab Potential  impaired cognition    PT Frequency  1x / week    PT Duration   12 weeks    PT Treatment/Interventions  ADLs/Self Care Home Management;DME Instruction;Gait training;Stair training;Functional mobility training;Therapeutic activities;Therapeutic exercise;Balance training;Neuromuscular re-education;Patient/family education;Passive range of motion    PT Next Visit Plan  gait training with Bil PLS AFOs, swiss ball seated exercise with written HEP    Consulted and Agree with Plan of Care  Patient       Patient will benefit from skilled therapeutic intervention in order to improve the following deficits and impairments:  Abnormal gait, Decreased activity tolerance, Decreased balance, Decreased cognition, Decreased coordination, Decreased endurance, Decreased knowledge of use of DME, Decreased knowledge of precautions, Decreased mobility, Decreased range of motion, Decreased strength, Impaired flexibility, Postural dysfunction  Visit Diagnosis: Muscle weakness (generalized)  Other symptoms and signs involving the musculoskeletal system  Other symptoms and signs involving the nervous system  Unsteadiness on feet     Problem List Patient Active Problem List   Diagnosis Date Noted  . 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 Cyst of right ovary 04/29/2016  . Thickened endometrium 04/29/2016  . Mental impairment 07/14/2014    Jamey Reas PT, DPT 08/12/2017, 1:01 PM  [facility] [address] [place], [place], [address] Phone: [phone]   Fax:  [phone]  Name: SEANNE CHIRICO MRN: [id number] Date of Birth: [birth year]

## 2017-08-17 NOTE — Addendum Note (Signed)
Addended by: Lester [place] A on: 08/17/2017 02:45 PM   Modules accepted: Orders

## 2017-08-17 NOTE — Telephone Encounter (Signed)
Received VO from Dr. Rexene Alberts to re-order AFOs with requested verbiage, per PT recommendations. Order placed.

## 2017-08-17 NOTE — Telephone Encounter (Signed)
I did put order in for AFOs in June. Can you look into this?

## 2017-08-18 ENCOUNTER — Ambulatory Visit: Payer: Medicaid Other | Admitting: Physical Therapy

## 2017-08-18 ENCOUNTER — Encounter: Payer: Self-pay | Admitting: Physical Therapy

## 2017-08-18 DIAGNOSIS — M6281 Muscle weakness (generalized): Secondary | ICD-10-CM | POA: Diagnosis not present

## 2017-08-18 DIAGNOSIS — R29898 Other symptoms and signs involving the musculoskeletal system: Secondary | ICD-10-CM

## 2017-08-18 DIAGNOSIS — M21372 Foot drop, left foot: Secondary | ICD-10-CM

## 2017-08-18 DIAGNOSIS — R29818 Other symptoms and signs involving the nervous system: Secondary | ICD-10-CM

## 2017-08-18 DIAGNOSIS — M21371 Foot drop, right foot: Secondary | ICD-10-CM

## 2017-08-18 DIAGNOSIS — R2681 Unsteadiness on feet: Secondary | ICD-10-CM

## 2017-08-19 NOTE — Therapy (Signed)
[facility] [address] [place], [place], [address] Phone: [phone]   Fax:  [phone]  Physical Therapy Treatment  Patient Details  Name: [name] MRN: [id number] Date of Birth: 1970/07/03 Referring Provider: Lucienne Capers, MD   Encounter Date: 08/18/2017  PT End of Session - 08/18/17 1208    Visit Number  8    Number of Visits  13    Authorization Type  Medicaid    Authorization Time Period  12 visits 06/17/17 - 09/08/17    Authorization - Visit Number  7    Authorization - Number of Visits  12    PT Start Time  1100    PT Stop Time  1147    PT Time Calculation (min)  47 min    Equipment Utilized During Treatment  Gait belt    Activity Tolerance  Patient tolerated treatment well    Behavior During Therapy  WFL for tasks assessed/performed       Past Medical History:  Diagnosis Date  . Fibroids   . Mental disorder   . 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 Pneumonia     Past Surgical History:  Procedure Laterality Date  . thumb surgery      There were no vitals filed for this visit.  Subjective Assessment - 08/18/17 1100    Subjective  She is getting feet impressions made today for foot orthoses.    Patient is accompained by:  Family member    Limitations  Lifting;Standing;Walking;House hold activities    Patient Stated Goals  to reduce falls, strength or maintain muscles    Currently in Pain?  No/denies                       [facility] Adult PT Treatment/Exercise - 08/18/17 1100      Transfers   Sit to Stand  5: Supervision;With upper extremity assist;With armrests;From chair/3-in-1 uses back of legs against chair to stabilize    Sit to Stand Details (indicate cue type and reason)  bilateral AFOs, worked on stabilizing    Stand to [employer]  5: Supervision;With upper extremity assist;With armrests;To chair/3-in-1 uses back of legs to stabilize    Stand to Sit Details  worked on  balance / stability with bilateral AFOs      Ambulation/Gait   Ambulation/Gait  Yes    Ambulation/Gait Assistance  4: Min guard;4: Min assist;5: Supervision MinGuard to supervision on indoor/paved, MinA grass    Ambulation/Gait Assistance Details  PT demo proper technique for hand hold assist for gait on grass, ramps or curbs.     Ambulation Distance (Feet)  200 Feet 200' X 2    Assistive device  1 person hand held assist;None bilateral AFOs    Ambulation Surface  Level;Indoor;Outdoor;Paved;Grass    Stairs  Yes    Stairs Assistance  5: Supervision    Stairs Assistance Details (indicate cue type and reason)  2 hands on single rail if only one available    Stair Management Technique  Two rails;One rail Left;One rail Right;Step to pattern;Forwards    Number of Stairs  4 3 reps    Ramp  4: Min assist;5: Supervision bilateral AFOs    Curb  4: Min assist;3: Mod assist bilateral AFOs    Curb Details (indicate cue type and reason)  Pt placed her arm on PT shoulder /around neck as how she is assisted by family.  PT instructed, demo proper technique  for hand hold assist for optimal support & minimize injury to person assisting             PT Education - 08/18/17 1140    Education provided  Yes    Education Details  exercise group at sheltered workshop mother to talk to [facility] coordinator about knee to avoid fatigue with Myotonic Dystrophy    Person(s) Educated  Patient;Parent(s)    Methods  Explanation;Verbal cues    Comprehension  Verbalized understanding       PT Short Term Goals - 07/28/17 1753      PT SHORT TERM GOAL #1   Title  Family verbalizes understanding of general guidelines for exercising with myotonic dystrophy. (All STGs Target Date 5th visit)    Baseline  MET 07/28/2017     Time  4    Period  Weeks    Status  Achieved      PT SHORT TERM GOAL #2   Title  Family demonstrates HEP for stretches to improve flexibility.     Baseline  MET 07/28/2017    Time  4    Period   Weeks    Status  Achieved      PT SHORT TERM GOAL #3   Title  Family demonstrates initial HEP to address strength & balance deficits.     Baseline  MET for exercises instructed on 07/28/2017      Time  4    Period  Weeks    Status  Achieved        PT Long Term Goals - 06/09/17 2011      PT LONG TERM GOAL #1   Title  Family demonstrates understanding of ongoing HEP for flexibility and range. (All LTGs Target Date 13th visit)    Baseline  Family are dependent in exercises with myotonic dystrophy and ROM are impacting balance /falls.     Time  12    Period  Weeks    Status  New    Target Date  09/01/17      PT LONG TERM GOAL #2   Title  Family verbalize and demonstrate ongoing HEP for strength, balance & endurance with Myotonic Dystrophy.     Baseline  Family is dependent in proper exercises with Myotonic Dystrophy.     Time  12    Period  Weeks    Status  New    Target Date  09/01/17      PT LONG TERM GOAL #3   Title  Ankle dorsiflexion PROM >-5* bilateral.     Baseline  Right ankle dorsiflexion -11* and left -18*    Time  12    Period  Weeks    Status  New    Target Date  09/01/17      PT LONG TERM GOAL #4   Title  Berg Balance >25/56 to indicate lower fall risk with standing activities.    Baseline  Merrilee Jansky Balance 19/56    Time  12    Period  Weeks    Status  New    Target Date  09/01/17      PT LONG TERM GOAL #5   Title  Patient ambulates around furniture 33' with supervision.     Baseline  Patient requires minA to negotiate around furniture safely.     Time  12    Period  Weeks    Status  New    Target Date  09/01/17      Additional Long Term  Goals   Additional Long Term Goals  Yes      PT LONG TERM GOAL #6   Title  Family demonstrates safe assist with ambulating outdoors on paved & grass surfaces, ramps, curbs & stairs including ability to determine Morrigan's endurance limitations.     Baseline  Family is dependent in safe techniques for community ambulation  with new diagnosis of myotonic dystrophy.     Time  12    Period  Weeks    Status  New    Target Date  09/01/17            Plan - 08/18/17 1200    Clinical Impression Statement  Mother reports that she was not aware of exercise group at sheltered workshop but verbalizes understanding of need to discuss exercise guidelines with Myotonic Dystrophy.  Patient improved balance & gait with bilateral AFOs to correct foot drop. Her mother appears to understand how to properly provide hand hold assist when needed.    Rehab Potential  Good    Clinical Impairments Affecting Rehab Potential  impaired cognition    PT Frequency  1x / week    PT Duration  12 weeks    PT Treatment/Interventions  ADLs/Self Care Home Management;DME Instruction;Gait training;Stair training;Functional mobility training;Therapeutic activities;Therapeutic exercise;Balance training;Neuromuscular re-education;Patient/family education;Passive range of motion    PT Next Visit Plan  gait training with Bil PLS AFOs, swiss ball seated exercise with written HEP    Consulted and Agree with Plan of Care  Patient       Patient will benefit from skilled therapeutic intervention in order to improve the following deficits and impairments:  Abnormal gait, Decreased activity tolerance, Decreased balance, Decreased cognition, Decreased coordination, Decreased endurance, Decreased knowledge of use of DME, Decreased knowledge of precautions, Decreased mobility, Decreased range of motion, Decreased strength, Impaired flexibility, Postural dysfunction  Visit Diagnosis: Muscle weakness (generalized)  Other symptoms and signs involving the musculoskeletal system  Other symptoms and signs involving the nervous system  Unsteadiness on feet  Bilateral foot-drop     Problem List Patient Active Problem List   Diagnosis Date Noted  . 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 Cyst of right ovary 04/29/2016  . Thickened endometrium  04/29/2016  . Mental impairment 07/14/2014    Jamey Reas PT, DPT 08/19/2017, 6:38 AM  [facility] [address] [place], [place], [address] Phone: [phone]   Fax:  [phone]  Name: RICHETTA CUBILLOS MRN: [id number] Date of Birth: 07/14/1970

## 2017-08-25 ENCOUNTER — Encounter: Payer: Self-pay | Admitting: Physical Therapy

## 2017-08-25 ENCOUNTER — Ambulatory Visit: Payer: Medicaid Other | Admitting: Physical Therapy

## 2017-08-25 DIAGNOSIS — M6281 Muscle weakness (generalized): Secondary | ICD-10-CM | POA: Diagnosis not present

## 2017-08-25 DIAGNOSIS — R29818 Other symptoms and signs involving the nervous system: Secondary | ICD-10-CM

## 2017-08-25 DIAGNOSIS — R2681 Unsteadiness on feet: Secondary | ICD-10-CM

## 2017-08-25 DIAGNOSIS — M21371 Foot drop, right foot: Secondary | ICD-10-CM

## 2017-08-25 DIAGNOSIS — M21372 Foot drop, left foot: Secondary | ICD-10-CM

## 2017-08-25 DIAGNOSIS — R29898 Other symptoms and signs involving the musculoskeletal system: Secondary | ICD-10-CM

## 2017-08-25 NOTE — Telephone Encounter (Signed)
Patient is scheduled at [facility] 09/04/2017 arrive at 9:45 for 10:am .  Dr. Aretha Parrot . Patient is aware.

## 2017-08-26 NOTE — Therapy (Signed)
[facility] [address] [place], [place], [address] Phone: [phone]   Fax:  [phone]  Physical Therapy Treatment  Patient Details  Name: [name] MRN: [id number] Date of Birth: August 02, 1970 Referring Provider: Lucienne Capers, MD   Encounter Date: 08/25/2017  PT End of Session - 08/25/17 1259    Visit Number  9    Number of Visits  13    Authorization Type  Medicaid    Authorization Time Period  12 visits 06/17/17 - 09/08/17    Authorization - Visit Number  8    Authorization - Number of Visits  12    PT Start Time  1104    PT Stop Time  1144    PT Time Calculation (min)  40 min    Equipment Utilized During Treatment  Gait belt    Activity Tolerance  Patient tolerated treatment well    Behavior During Therapy  WFL for tasks assessed/performed       Past Medical History:  Diagnosis Date  . Fibroids   . Mental disorder   . 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 Pneumonia     Past Surgical History:  Procedure Laterality Date  . thumb surgery      There were no vitals filed for this visit.  Subjective Assessment - 08/25/17 1105    Subjective  No falls. She is having impressions made for foot orthoses today. Mother has not spoken to [employer].     Patient is accompained by:  Family member    Limitations  Lifting;Standing;Walking;House hold activities    Patient Stated Goals  to reduce falls, strength or maintain muscles    Currently in Pain?  No/denies                       [facility] Adult PT Treatment/Exercise - 08/25/17 1100      Ambulation/Gait   Ambulation/Gait  Yes    Ambulation/Gait Assistance  4: Min guard;5: Supervision    Ambulation/Gait Assistance Details  PT demo to mother how to provide assist for gait on grass/sand, ramps & curbs      Neuro Re-ed    Neuro Re-ed Details   In parallel bars with tactile & verbal cues, seated on 24" stool- red theraband  reciprocal UE & BUE 5 reps ea. rows, overhead reach and forward reach  and trunk lean with recovery back, forward, right, left and rotation rt/lt.           Balance Exercises - 08/25/17 1100      Balance Exercises: Standing   Standing Eyes Opened  Wide (BOA);Foam/compliant surface;Other reps (comment);Other (comment);Head turns 10 reps head turns rt/lt & up/down, tactile cues    Standing Eyes Closed  Wide (BOA);Head turns;Foam/compliant surface;Other reps (comment) 10 reps head turns rt/lt & up/down, tactile cues    Heel Raises Limitations  10 reps with light UE support    Toe Raise Limitations  single LE 10 reps right & left        PT Education - 08/25/17 1140    Education provided  Yes    Education Details  technique to assist gait with ramps, curbs, grass/sand;  use of grip socks or water shoes to protect feet in pool or beach.  alternate muscle groups to decrease fatigue.    Person(s) Educated  Patient;Parent(s)    Methods  Explanation    Comprehension  Verbalized understanding  PT Short Term Goals - 07/28/17 1753      PT SHORT TERM GOAL #1   Title  Family verbalizes understanding of general guidelines for exercising with myotonic dystrophy. (All STGs Target Date 5th visit)    Baseline  MET 07/28/2017     Time  4    Period  Weeks    Status  Achieved      PT SHORT TERM GOAL #2   Title  Family demonstrates HEP for stretches to improve flexibility.     Baseline  MET 07/28/2017    Time  4    Period  Weeks    Status  Achieved      PT SHORT TERM GOAL #3   Title  Family demonstrates initial HEP to address strength & balance deficits.     Baseline  MET for exercises instructed on 07/28/2017      Time  4    Period  Weeks    Status  Achieved        PT Long Term Goals - 08/25/17 1300      PT LONG TERM GOAL #1   Title  Family demonstrates understanding of ongoing HEP for flexibility and range. (All LTGs Target Date 13th visit)    Baseline  Family are dependent in  exercises with myotonic dystrophy and ROM are impacting balance /falls.     Time  12    Period  Weeks    Status  On-going    Target Date  09/08/17      PT LONG TERM GOAL #2   Title  Family verbalize and demonstrate ongoing HEP for strength, balance & endurance with Myotonic Dystrophy.     Baseline  Family is dependent in proper exercises with Myotonic Dystrophy.     Time  12    Period  Weeks    Status  On-going    Target Date  09/08/17      PT LONG TERM GOAL #3   Title  Ankle dorsiflexion PROM >-5* bilateral.     Baseline  Right ankle dorsiflexion -11* and left -18*    Time  12    Period  Weeks    Status  On-going    Target Date  09/08/17      PT LONG TERM GOAL #4   Title  Berg Balance >25/56 to indicate lower fall risk with standing activities.    Baseline  Merrilee Jansky Balance 19/56    Time  12    Period  Weeks    Status  On-going    Target Date  09/08/17      PT LONG TERM GOAL #5   Title  Patient ambulates around furniture 73' with supervision.     Baseline  Patient requires minA to negotiate around furniture safely.     Time  12    Period  Weeks    Status  On-going    Target Date  09/08/17      PT LONG TERM GOAL #6   Title  Family demonstrates safe assist with ambulating outdoors on paved & grass surfaces, ramps, curbs & stairs including ability to determine Camille's endurance limitations.     Baseline  Family is dependent in safe techniques for community ambulation with new diagnosis of myotonic dystrophy.     Time  12    Period  Weeks    Status  On-going    Target Date  09/08/17            Plan -  08/25/17 1645    Clinical Impression Statement  Today's skilled session focused on balance activities & neuromuscular strengthening. Patient's mother has better understanding of exercising with Myotonic Dystrophy.    Rehab Potential  Good    Clinical Impairments Affecting Rehab Potential  impaired cognition    PT Frequency  1x / week    PT Duration  12 weeks    PT  Treatment/Interventions  ADLs/Self Care Home Management;DME Instruction;Gait training;Stair training;Functional mobility training;Therapeutic activities;Therapeutic exercise;Balance training;Neuromuscular re-education;Patient/family education;Passive range of motion    PT Next Visit Plan  gait training with Bil PLS AFOs, swiss ball seated exercise with written HEP    Consulted and Agree with Plan of Care  Patient       Patient will benefit from skilled therapeutic intervention in order to improve the following deficits and impairments:  Abnormal gait, Decreased activity tolerance, Decreased balance, Decreased cognition, Decreased coordination, Decreased endurance, Decreased knowledge of use of DME, Decreased knowledge of precautions, Decreased mobility, Decreased range of motion, Decreased strength, Impaired flexibility, Postural dysfunction  Visit Diagnosis: Muscle weakness (generalized)  Other symptoms and signs involving the musculoskeletal system  Other symptoms and signs involving the nervous system  Unsteadiness on feet  Bilateral foot-drop     Problem List Patient Active Problem List   Diagnosis Date Noted  . 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 Cyst of right ovary 04/29/2016  . Thickened endometrium 04/29/2016  . Mental impairment 07/14/2014    Jamey Reas PT, DPT 08/26/2017, 6:47 AM  [facility] [address] [place], [place], [address] Phone: [phone]   Fax:  [phone]  Name: [name] MRN: [id number] Date of Birth: Sep 11, 1970

## 2017-09-01 ENCOUNTER — Ambulatory Visit: Payer: Medicaid Other | Admitting: Physical Therapy

## 2017-09-08 ENCOUNTER — Ambulatory Visit: Payer: Medicaid Other | Admitting: Physical Therapy

## 2017-09-08 ENCOUNTER — Encounter: Payer: Self-pay | Admitting: Physical Therapy

## 2017-09-08 DIAGNOSIS — R29818 Other symptoms and signs involving the nervous system: Secondary | ICD-10-CM

## 2017-09-08 DIAGNOSIS — R2681 Unsteadiness on feet: Secondary | ICD-10-CM

## 2017-09-08 DIAGNOSIS — M21372 Foot drop, left foot: Secondary | ICD-10-CM

## 2017-09-08 DIAGNOSIS — M6281 Muscle weakness (generalized): Secondary | ICD-10-CM

## 2017-09-08 DIAGNOSIS — R29898 Other symptoms and signs involving the musculoskeletal system: Secondary | ICD-10-CM

## 2017-09-08 DIAGNOSIS — M21371 Foot drop, right foot: Secondary | ICD-10-CM

## 2017-09-08 NOTE — Therapy (Addendum)
[facility] [address] [place], [place], [address] Phone: [phone]   Fax:  [phone]  Physical Therapy Treatment  Patient Details  Name: [name] MRN: [id number] Date of Birth: 01-Aug-1970 Referring Provider: Lucienne Capers, MD   Encounter Date: 09/08/2017  PT End of Session - 09/08/17 1023    Visit Number  10    Number of Visits  13    Authorization Type  Medicaid    Authorization Time Period  12 visits 06/17/17 - 09/08/17    Authorization - Visit Number  9    Authorization - Number of Visits  12    PT Start Time  1020    PT Stop Time  1059    PT Time Calculation (min)  39 min    Equipment Utilized During Treatment  Gait belt    Activity Tolerance  Patient tolerated treatment well    Behavior During Therapy  WFL for tasks assessed/performed       Past Medical History:  Diagnosis Date  . Fibroids   . Mental disorder   . 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 Pneumonia     Past Surgical History:  Procedure Laterality Date  . thumb surgery      There were no vitals filed for this visit.  Subjective Assessment - 09/08/17 1022    Subjective  No new complaints. No falls or pain. When asked about braces pt's sister stated they thought the braces were being brought to her at PT today. When primary PT was asked she stated she thought the pt was suppossed to go to [facility] before coming here. Call placed to [facility] by this PTA and was informed that Jinny Blossom was on her way over with the braces.     Patient is accompained by:  Family member sister    Limitations  Lifting;Standing;Walking;House hold activities    Patient Stated Goals  to reduce falls, strength or maintain muscles    Currently in Pain?  No/denies         [facility] PT Assessment - 09/08/17 1025      ROM / Strength   AROM / PROM / Strength  AROM      Berg Balance Test   Sit to Stand  Able to stand without using hands and stabilize  independently    Standing Unsupported  Able to stand safely 2 minutes    Sitting with Back Unsupported but Feet Supported on Floor or Stool  Able to sit safely and securely 2 minutes    Stand to Sit  Controls descent by using hands    Transfers  Able to transfer safely, minor use of hands    Standing Unsupported with Eyes Closed  Able to stand 10 seconds safely    Standing Ubsupported with Feet Together  Able to place feet together independently and stand 1 minute safely    From Standing, Reach Forward with Outstretched Arm  Can reach forward >12 cm safely (5") 5 inches    From Standing Position, Pick up Object from Floor  Able to pick up shoe safely and easily    From Standing Position, Turn to Look Behind Over each Shoulder  Looks behind from both sides and weight shifts well    Turn 360 Degrees  Able to turn 360 degrees safely but slowly    Standing Unsupported, Alternately Place Feet on Step/Stool  Able to stand independently and safely and complete 8 steps in 20 seconds used 4  inch box    Standing Unsupported, One Foot in Front  Able to take small step independently and hold 30 seconds    Standing on One Leg  Able to lift leg independently and hold equal to or more than 3 seconds    Total Score  48            [facility] Adult PT Treatment/Exercise - 09/08/17 1037      Transfers   Transfers  Sit to Stand;Stand to Sit    Sit to Stand  6: Modified independent (Device/Increase time);With upper extremity assist;From bed;From chair/3-in-1    Stand to Sit  6: Modified independent (Device/Increase time);With upper extremity assist;To bed;To chair/3-in-1      Ambulation/Gait   Ambulation/Gait  Yes    Ambulation/Gait Assistance  4: Min guard;5: Supervision    Ambulation/Gait Assistance Details  no braces on for outdoor gait as they had not arrived from [facility]. Megan in clinic on return from outside to fit pt with her new braces. gait indoors with new bil posterior AFO's with min assist for  balance initially, progressing to min gaurd assist as she accomodated to them.     Ambulation Distance (Feet)  500 Feet x1, 200 x1 indoor only    Assistive device  None    Gait Pattern  Step-through pattern;Decreased arm swing - right;Decreased stance time - right;Decreased step length - left;Decreased stride length;Right steppage;Left steppage;Right foot flat;Left foot flat;Right genu recurvatum;Left genu recurvatum;Lateral hip instability;Trunk flexed;Narrow base of support;Poor foot clearance - left;Poor foot clearance - right    Ambulation Surface  Level;Unlevel;Indoor;Outdoor;Paved;Grass      Self-Care   Other Self-Care Comments   patient and sister educated on brace components, donning/doffing and initiation of wear schedule.  Advised that someone be close/holding patient for now until she accomodates to them.                PT Short Term Goals - 07/28/17 1753      PT SHORT TERM GOAL #1   Title  Family verbalizes understanding of general guidelines for exercising with myotonic dystrophy. (All STGs Target Date 5th visit)    Baseline  MET 07/28/2017     Time  4    Period  Weeks    Status  Achieved      PT SHORT TERM GOAL #2   Title  Family demonstrates HEP for stretches to improve flexibility.     Baseline  MET 07/28/2017    Time  4    Period  Weeks    Status  Achieved      PT SHORT TERM GOAL #3   Title  Family demonstrates initial HEP to address strength & balance deficits.     Baseline  MET for exercises instructed on 07/28/2017      Time  4    Period  Weeks    Status  Achieved        PT Long Term Goals - 09/08/17 1023      PT LONG TERM GOAL #1   Title  Family demonstrates understanding of ongoing HEP for flexibility and range. (All LTGs Target Date 13th visit)    Baseline  09/08/17: met today per patient/sister    Status  Achieved      PT LONG TERM GOAL #2   Title  Family verbalize and demonstrate ongoing HEP for strength, balance & endurance with Myotonic  Dystrophy.     Baseline  09/08/17: met today per patient and sister  Status  Achieved      PT LONG TERM GOAL #3   Title  Ankle dorsiflexion PROM >-5* bilateral.     Baseline  09/08/17: most recent measurments in flow sheet stated WFL's. Discussed with primary PT who stated no need to measure today.     Time  --    Period  --    Status  Deferred      PT LONG TERM GOAL #4   Title  Berg Balance >25/56 to indicate lower fall risk with standing activities.    Baseline  09/08/17: 48/56  scored today    Time  --    Period  --    Status  Achieved      PT LONG TERM GOAL #5   Title  Patient ambulates around furniture 9' with supervision.     Baseline  09/08/17: met in session today without braces on. with bil braces on pt needs min guard to min assist with gait.     Time  --    Period  --    Status  Achieved      PT LONG TERM GOAL #6   Title  Family demonstrates safe assist with ambulating outdoors on paved & grass surfaces, ramps, curbs & stairs including ability to determine Marjory's endurance limitations.     Baseline  09/08/17: met today with sister and patient on indoor/outdoor surfaces, including inclines/declines. unable to formally assess curbs and stairs due to time contraints, however pt/sister report no issues with these barriers.     Time  --    Period  --    Status  Achieved            Plan - 09/08/17 1023    Clinical Impression Statement  Today's skilled session focused on progress toward LTGs for discharge with all goals met except one goal was deffered. Also had delivery of pt's bil AFO's with todays session. Pt does have slight more unsteadiness with gait with braces that improved over time/distance as she became more accustomed to them. Advised pt's sister that she should not ambulate alone (someone near her) until she is fully accostomed to the braces. Pt and sister verbalized understanding. Both in agreement with discharge today.     Rehab Potential  Good    Clinical  Impairments Affecting Rehab Potential  impaired cognition    PT Frequency  1x / week    PT Duration  12 weeks    PT Treatment/Interventions  ADLs/Self Care Home Management;DME Instruction;Gait training;Stair training;Functional mobility training;Therapeutic activities;Therapeutic exercise;Balance training;Neuromuscular re-education;Patient/family education;Passive range of motion    PT Next Visit Plan  discharge today per PT plan of care    Consulted and Agree with Plan of Care  Patient       Patient will benefit from skilled therapeutic intervention in order to improve the following deficits and impairments:  Abnormal gait, Decreased activity tolerance, Decreased balance, Decreased cognition, Decreased coordination, Decreased endurance, Decreased knowledge of use of DME, Decreased knowledge of precautions, Decreased mobility, Decreased range of motion, Decreased strength, Impaired flexibility, Postural dysfunction  Visit Diagnosis: Muscle weakness (generalized)  Unsteadiness on feet  Bilateral foot-drop  Other symptoms and signs involving the nervous system  Other symptoms and signs involving the musculoskeletal system     Problem List Patient Active Problem List   Diagnosis Date Noted  . 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 Cyst of right ovary 04/29/2016  . Thickened endometrium 04/29/2016  . Mental impairment 07/14/2014  Willow Ora, PTA, [facility] [address] [place], [place] [address] [phone] 09/08/17, 10:21 PM   Name: [name] MRN: [id number] Date of Birth: 12-21-1970  PHYSICAL THERAPY DISCHARGE SUMMARY  Visits from Start of Care: 10  Current functional level related to goals / functional outcomes: PT Long Term Goals - 09/08/17 1023      PT LONG TERM GOAL #1   Title  Family demonstrates understanding of ongoing HEP for flexibility and range. (All LTGs Target Date 13th visit)    Baseline  09/08/17: met today  per patient/sister    Status  Achieved      PT LONG TERM GOAL #2   Title  Family verbalize and demonstrate ongoing HEP for strength, balance & endurance with Myotonic Dystrophy.     Baseline  09/08/17: met today per patient and sister    Status  Achieved      PT LONG TERM GOAL #3   Title  Ankle dorsiflexion PROM >-5* bilateral.     Baseline  09/08/17: most recent measurments in flow sheet stated WFL's. Discussed with primary PT who stated no need to measure today.     Time  --    Period  --    Status  Deferred      PT LONG TERM GOAL #4   Title  Berg Balance >25/56 to indicate lower fall risk with standing activities.    Baseline  09/08/17: 48/56  scored today    Time  --    Period  --    Status  Achieved      PT LONG TERM GOAL #5   Title  Patient ambulates around furniture 87' with supervision.     Baseline  09/08/17: met in session today without braces on. with bil braces on pt needs min guard to min assist with gait.     Time  --    Period  --    Status  Achieved      PT LONG TERM GOAL #6   Title  Family demonstrates safe assist with ambulating outdoors on paved & grass surfaces, ramps, curbs & stairs including ability to determine Nadra's endurance limitations.     Baseline  09/08/17: met today with sister and patient on indoor/outdoor surfaces, including inclines/declines. unable to formally assess curbs and stairs due to time contraints, however pt/sister report no issues with these barriers.     Time  --    Period  --    Status  Achieved        Remaining deficits: Weakness & high fall risk. Patient's caregivers have been educated on HEP & how to safely assist.    Education / Equipment: HEP & activities with Myotonic Dystrophy, proper technique for assistance,  Patient received bilateral AFOs & foot orthoses. Plan: Patient agrees to discharge.  Patient goals were met. Patient is being discharged due to meeting the stated rehab goals.  ?????         Jamey Reas,  PT, DPT PT Specializing in [place] 09/10/17 7:50 PM Phone:  [phone]  Fax:  [phone] [facility] [address] [place], [place] [address]

## 2017-09-09 ENCOUNTER — Other Ambulatory Visit: Payer: Self-pay

## 2017-09-09 ENCOUNTER — Emergency Department (HOSPITAL_COMMUNITY)
Admission: EM | Admit: 2017-09-09 | Discharge: 2017-09-10 | Disposition: A | Discharge status: Home / Self Care | Payer: Medicaid Other | Attending: Emergency Medicine | Admitting: Emergency Medicine

## 2017-09-09 ENCOUNTER — Emergency Department (HOSPITAL_COMMUNITY): Payer: Medicaid Other

## 2017-09-09 ENCOUNTER — Encounter (HOSPITAL_COMMUNITY): Payer: Self-pay

## 2017-09-09 DIAGNOSIS — Y939 Activity, unspecified: Secondary | ICD-10-CM | POA: Insufficient documentation

## 2017-09-09 DIAGNOSIS — S0990XA Unspecified injury of head, initial encounter: Secondary | ICD-10-CM | POA: Diagnosis not present

## 2017-09-09 DIAGNOSIS — Y998 Other external cause status: Secondary | ICD-10-CM | POA: Insufficient documentation

## 2017-09-09 DIAGNOSIS — W19XXXA Unspecified fall, initial encounter: Secondary | ICD-10-CM

## 2017-09-09 DIAGNOSIS — Z79899 Other long term (current) drug therapy: Secondary | ICD-10-CM | POA: Diagnosis not present

## 2017-09-09 DIAGNOSIS — W109XXA Fall (on) (from) unspecified stairs and steps, initial encounter: Secondary | ICD-10-CM | POA: Diagnosis not present

## 2017-09-09 DIAGNOSIS — Y929 Unspecified place or not applicable: Secondary | ICD-10-CM | POA: Insufficient documentation

## 2017-09-09 LAB — CBC WITH DIFFERENTIAL/PLATELET
Abs Immature Granulocytes: 0 10*3/uL (ref 0.0–0.1)
Basophils Absolute: 0 10*3/uL (ref 0.0–0.1)
Basophils Relative: 1 %
EOS ABS: 0 10*3/uL (ref 0.0–0.7)
Eosinophils Relative: 1 %
HEMATOCRIT: 45 % (ref 36.0–46.0)
Hemoglobin: 13.1 g/dL (ref 12.0–15.0)
IMMATURE GRANULOCYTES: 0 %
LYMPHS PCT: 21 %
Lymphs Abs: 0.9 10*3/uL (ref 0.7–4.0)
MCH: 25.7 pg — ABNORMAL LOW (ref 26.0–34.0)
MCHC: 29.1 g/dL — ABNORMAL LOW (ref 30.0–36.0)
MCV: 88.4 fL (ref 78.0–100.0)
MONOS PCT: 6 %
Monocytes Absolute: 0.3 10*3/uL (ref 0.1–1.0)
NEUTROS PCT: 71 %
Neutro Abs: 3.2 10*3/uL (ref 1.7–7.7)
Platelets: 191 10*3/uL (ref 150–400)
RBC: 5.09 MIL/uL (ref 3.87–5.11)
RDW: 15.2 % (ref 11.5–15.5)
WBC: 4.5 10*3/uL (ref 4.0–10.5)

## 2017-09-09 LAB — BASIC METABOLIC PANEL
ANION GAP: 8 (ref 5–15)
BUN: 15 mg/dL (ref 6–20)
CO2: 30 mmol/L (ref 22–32)
Calcium: 9.1 mg/dL (ref 8.9–10.3)
Chloride: 109 mmol/L (ref 98–111)
Creatinine, Ser: 0.83 mg/dL (ref 0.44–1.00)
GFR calc non Af Amer: >60 mL/min (ref >60)
GLUCOSE: 117 mg/dL — AB (ref 70–99)
POTASSIUM: 4 mmol/L (ref 3.5–5.1)
Sodium: 147 mmol/L — ABNORMAL HIGH (ref 135–145)

## 2017-09-09 LAB — I-STAT BETA HCG BLOOD, ED (MC, WL, AP ONLY): I-stat hCG, quantitative: <5 m[IU]/mL (ref <5)

## 2017-09-09 NOTE — ED Notes (Signed)
ED Provider at bedside. 

## 2017-09-09 NOTE — ED Triage Notes (Signed)
Pt here with mother who reports that pt fell down stairs today, unwitnessed, denies LOC. Pt has hematoma to right forehead.

## 2017-09-09 NOTE — ED Provider Notes (Signed)
Patient placed in Quick Look pathway, seen and evaluated   Chief Complaint: fall just PTA  HPI:   Pt is a 47 y.o. female with a PMHx of myotonic dystrophy type 1 and mental retardation, presenting today accompanied by her mother, with c/o unwitnessed fall just PTA. Mother states that she was coming downstairs and she heard her fall, unsure how high up she was but mother says it sounded like she was pretty far up the staircase when she fell. +Head injury, no LOC, mother got to pt very quickly and pt was awake. No blood thinners. Has knot on R forehead. Pt denies pain anywhere, including neck or back pain. Denies HA or vision changes. Mother denies any changes in urination recently, no cough or URI symptoms. Behavior normal recently. Unclear what caused the fall.   ROS: +Head injury, no LOC. Denies pain anywhere, HA, vision changes. Mother denies URI symptoms, cough, confusion/abnormal behavior, or recent changes in urine.   Physical Exam:  BP (!) 133/97 (BP Location: Right Arm)   Pulse 87   Temp 98.2 F (36.8 C) (Oral)   Resp 20   Ht 5\' 2"  (1.575 m)   Wt 47.6 kg (105 lb)   SpO2 99%   BMI 19.20 kg/m    Gen: No distress  Neuro: Awake and Alert  Skin: Warm    Focused Exam: Eyes: PERRL, EOMI, no nystagmus. Neck: FROM intact without spinous process TTP, no bony stepoffs or deformities, no paraspinous muscle TTP or muscle spasms. No rigidity or meningeal signs. No bruising or swelling. HENT: moderate sized hematoma to R forehead, no focal scalp or facial TTP, no crepitus or deformity. No bruising or wounds. No malocclusion.    Initiation of care has begun. The patient has been counseled on the process, plan, and necessity for staying for the completion/evaluation, and the remainder of the medical screening examination     [address] [place], [place] 09/09/17 1726    Blanchie Dessert, MD 09/10/17 [phone]

## 2017-09-09 NOTE — ED Provider Notes (Signed)
[facility] EMERGENCY DEPARTMENT Provider Note   CSN: [id number] Arrival date & time: 09/09/17  1638     History   Chief Complaint Chief Complaint  Patient presents with  . Fall   Family at bedside assists with history taking.  HPI [name] is a 47 y.o. female.  HPI   Pt is a 47 y/o female with a h/o mental disorder, myotonic dystrophy, who presents to the ED today for evaluation after a fall that occurred PTA. PT states that she was walking down the stairs and lost her balance. States she was at the bottom of the stairs when this happened. States that she fell down and hit her head. Did not lose consciousness. Denies any prodrome of chest pain, shortness of breath or palpitations. No neck or back pain. No current headaches, vision changes, cp, sob, abd pain, nv, pain to BUE/BLE, numbness/weakness. Pt not on blood thinners. Reports small abrasion to right ankle but no pain.   Family at bedside states that patient is at her mental baseline.  She is normally not oriented to situation or time.  Past Medical History:  Diagnosis Date  . Fibroids   . Mental disorder   . 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 Pneumonia     Patient Active Problem List   Diagnosis Date Noted  . 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 Cyst of right ovary 04/29/2016  . Thickened endometrium 04/29/2016  . Mental impairment 07/14/2014    Past Surgical History:  Procedure Laterality Date  . thumb surgery       OB History    Gravida  0   Para  0   Term  0   Preterm  0   AB  0   Living  0     SAB  0   TAB  0   Ectopic  0   Multiple  0   Live Births  0            Home Medications    Prior to Admission medications   Medication Sig Start Date End Date Taking? Authorizing Provider  aspirin EC 81 MG tablet Take 81 mg by mouth daily as needed for mild pain.    Yes [provider]  levonorgestrel (MIRENA) 20 MCG/24HR IUD 1 each  by Intrauterine route once.   Yes [provider]  Multiple Vitamin (MULTIVITAMIN) capsule Take 1 capsule by mouth daily.   Yes [provider]    Family History Family History  Problem Relation Age of Onset  . Breast cancer Mother   . Breast cancer Maternal Aunt 57    Social History Social History   Tobacco Use  . Smoking status: Never Smoker  . Smokeless tobacco: Never Used  Substance Use Topics  . Alcohol use: No    Alcohol/week: 0.0 oz  . Drug use: No     Allergies   Patient has no known allergies.   Review of Systems Review of Systems  Constitutional: Negative for fever.  HENT: Negative for sore throat.   Eyes: Negative for visual disturbance.  Respiratory: Negative for shortness of breath.   Cardiovascular: Negative for chest pain.  Gastrointestinal: Negative for abdominal pain, nausea and vomiting.  Genitourinary: Negative for dysuria and hematuria.  Musculoskeletal: Negative for back pain and neck pain.  Skin: Positive for wound.  Neurological: Negative for dizziness, weakness, light-headedness, numbness and headaches.    Physical Exam Updated Vital Signs BP Marland Kitchen)  145/91 (BP Location: Right Arm)   Pulse 68   Temp (!) 97.5 F (36.4 C) (Oral)   Resp 14   Ht 5\' 2"  (1.575 m)   Wt 47.6 kg (105 lb)   SpO2 100%   BMI 19.20 kg/m   Physical Exam  Constitutional: She appears well-developed and well-nourished. No distress.  HENT:  Head: Normocephalic.  Mouth/Throat: Oropharynx is clear and moist.  hematoma  Eyes: Conjunctivae are normal.  Neck: Neck supple.  Cardiovascular: Normal rate, regular rhythm and normal heart sounds.  No murmur heard. Pulmonary/Chest: Effort normal and breath sounds normal. No respiratory distress. She has no wheezes.  Abdominal: Soft. Bowel sounds are normal. She exhibits no distension. There is no tenderness. There is no guarding.  Musculoskeletal: She exhibits no edema.  No TTP to the cervical, thoracic, or  lumbar spine. No pain to the paraspinous muscles. Small abrasion to right lateral ankle without TTP  Neurological: She is alert.  Mental Status:  Alert, thought content appropriate, able to give a coherent history. Speech fluent without evidence of aphasia. Able to follow 2 step commands without difficulty. Patient oriented to self and place, not date and time (baseline per family close) Motor:  Normal tone. 5/5 strength of BUE and BLE major muscle groups including strong and equal grip strength and dorsiflexion/plantar flexion Sensory: light touch normal in all extremities. CV: 2+ radial and DP/PT pulses  Skin: Skin is warm and dry. Capillary refill takes less than 2 seconds.  Psychiatric: She has a normal mood and affect.  Nursing note and vitals reviewed.  ED Treatments / Results  Labs (all labs ordered are listed, but only abnormal results are displayed) Labs Reviewed  CBC WITH DIFFERENTIAL/PLATELET - Abnormal; Notable for the following components:      Result Value   MCH 25.7 (*)    MCHC 29.1 (*)    All other components within normal limits  BASIC METABOLIC PANEL - Abnormal; Notable for the following components:   Sodium 147 (*)    Glucose, Bld 117 (*)    All other components within normal limits  URINALYSIS, ROUTINE W REFLEX MICROSCOPIC  I-STAT BETA HCG BLOOD, ED ([facility], [facility], [facility] ONLY)    EKG EKG Interpretation  Date/Time:  Wednesday September 09 2017 17:33:48 EDT Ventricular Rate:  98 PR Interval:  192 QRS Duration: 110 QT Interval:  354 QTC Calculation: 451 R Axis:   -108 Text Interpretation:  Sinus rhythm with Premature ventricular complexes or Fusion complexes Possible Left atrial enlargement Right superior axis deviation Incomplete right bundle branch block Right ventricular hypertrophy Anterior infarct , age undetermined Abnormal ECG Confirmed by Milton Ferguson [phone]) on 09/09/2017 9:14:24 PM   Radiology Ct Head Wo Contrast  Result Date: 09/09/2017 CLINICAL DATA:  Fall  down stairs. Unwitnessed fall. No loss of consciousness. Scalp hematoma. EXAM: CT HEAD WITHOUT CONTRAST TECHNIQUE: Contiguous axial images were obtained from the base of the skull through the vertex without intravenous contrast. COMPARISON:  None. FINDINGS: Brain: Scattered subcortical white matter hypoattenuation is mildly advanced for age. Atrophy is advanced for age. There is thinning of the corpus callosum. Basal ganglia calcifications are noted bilaterally. There is no hydrocephalus. No mass lesion is present. The brainstem and cerebellum are within limits. Vascular: No hyperdense vessel or unexpected calcification. Skull: Calvarium is within normal limits. Hyperostosis frontalis internus is noted. Right frontal scalp soft tissue swelling and probable hematoma is present without an underlying fracture. Sinuses/Orbits: The paranasal sinuses and mastoid air cells are clear. Globes  and orbits are within normal limits. IMPRESSION: 1. Right frontal scalp soft tissue swelling and hematoma without underlying fracture. 2. No acute intracranial abnormality. 3. Mild subcortical white matter hypoattenuation throughout both hemispheres with mild age advanced atrophy. Electronically Signed   By: San Morelle M.D.   On: 09/09/2017 19:04    Procedures Procedures (including critical care time)  Medications Ordered in ED Medications - No data to display   Initial Impression / Assessment and Plan / ED Course  I have reviewed the triage vital signs and the nursing notes.  Pertinent labs & imaging results that were available during my care of the patient were reviewed by me and considered in my medical decision making (see chart for details).    Discussed pt presentation and exam findings with Dr. Roderic Palau, who reviewed the ECG and states that ECG is non-concerning and pt can f/u as an outpatient with regards to this.   Final Clinical Impressions(s) / ED Diagnoses   Final diagnoses:  None   Patient  presenting for evaluation after mechanical fall that occurred earlier today.  Patient fell down the stairs.  Vital signs stable.  Afebrile.  Reports head trauma but no LOC.  Denies any other complaints on exam.  No preceding symptoms of chest pain, shortness of breath, palpitations.  No other injuries from the fall.  No midline tenderness.  No chest wall tenderness or abdominal tenderness.  Lab work is reassuring.  ECG is with normal sinus rhythm and PVCs.  Left atrial enlargement, right superior axis deviation and incomplete right bundle branch block with right ventricular hypertrophy.  Has anterior infarct, age undetermined.  Patient has no chest pain no shortness of breath.  ECG felt to be non-concerning at this time given she is asymptomatic.  CT scan of the head is negative.  At the time of shift change, UA is currently pending.  Patient care was signed out to Melina Schools, PA-C with plans to follow-up on pending UA.  If negative then patient can be discharged to follow-up as an outpatient.  If positive then she will need to be treated with antibiotics however she can still be treated for this as an outpatient.   ED Discharge Orders    None       Bishop Dublin 09/09/17 2332    Milton Ferguson, MD 09/12/17 [phone]

## 2017-09-09 NOTE — Discharge Instructions (Signed)
Please follow up with your primary care provider within 5-7 days for re-evaluation of your symptoms. If you do not have a primary care provider, information for a healthcare clinic has been provided for you to make arrangements for follow up care. Please return to the emergency department for any new or worsening symptoms. ° °

## 2017-09-10 LAB — URINALYSIS, ROUTINE W REFLEX MICROSCOPIC
Bilirubin Urine: NEGATIVE
GLUCOSE, UA: NEGATIVE mg/dL
Ketones, ur: NEGATIVE mg/dL
Nitrite: NEGATIVE
PH: 5 (ref 5.0–8.0)
Protein, ur: NEGATIVE mg/dL
Specific Gravity, Urine: 1.004 — ABNORMAL LOW (ref 1.005–1.030)

## 2017-09-10 NOTE — ED Provider Notes (Addendum)
Care assumed from previous provider PA Couture. Please see their note for further details to include full history and physical. To summarize in short pt is a 47 year old female presents to the ED for mechanical fall.  Imaging reassuring.. Case discussed, plan agreed upon.  At time of care handoff was awaiting UA.  However patient has no urinary symptoms.  She is afebrile.  Lab lost patient's urine.  Patient and family would like to be discharged home.  Will follow with the urine and call them with results and if patient needs antibiotics will call in prescription to pharmacy.  Patient's vital signs remained reassuring.  She is alert oriented x3.  Discussed follow-up with primary care and return precautions.  Pt is hemodynamically stable, in NAD, & able to ambulate in the ED. Evaluation does not show pathology that would require ongoing emergent intervention or inpatient treatment. I explained the diagnosis to the patient. Pain has been managed & has no complaints prior to dc. Pt is comfortable with above plan and is stable for discharge at this time. All questions were answered prior to disposition. Strict return precautions for f/u to the ED were discussed. Encouraged follow up with PCP.  UA returned that shows few leukocytes and rare bacteria is nitrite negative.  Patient has no urinary symptoms.  Will not treat at this time.  I attempted to call patient and family with the provided phone number that he did not answer.  The mailbox was full.  Unable to give results to patient or family.      Doristine Devoid, PA-C 09/10/17 0037    Doristine Devoid, PA-C 09/10/17 8416    Milton Ferguson, MD 09/12/17 [phone]

## 2017-09-10 NOTE — ED Notes (Signed)
Spoke with lab twice. Lab stated did not receive urine for pt.

## 2018-06-24 ENCOUNTER — Other Ambulatory Visit: Payer: Self-pay | Admitting: Family Medicine

## 2018-06-24 DIAGNOSIS — Z1231 Encounter for screening mammogram for malignant neoplasm of breast: Secondary | ICD-10-CM

## 2018-06-30 IMAGING — US US TRANSVAGINAL NON-OB
1 series · 15 of 25 positions shown · non-contrast
Comparison: None

CLINICAL DATA: Perimenopausal symptoms. History of fibroid.
Irregular periods.

EXAM:
TRANSABDOMINAL AND TRANSVAGINAL ULTRASOUND OF PELVIS
TECHNIQUE: Both transabdominal and transvaginal ultrasound examinations of the
pelvis were performed. Transabdominal technique was performed for
global imaging of the pelvis including uterus, ovaries, adnexal
regions, and pelvic cul-de-sac. It was necessary to proceed with
endovaginal exam following the transabdominal exam to visualize the
ovaries and adnexa.

[Series 1: us transvaginal non-ob · 15 of 156 slices shown]
[im 1/156]
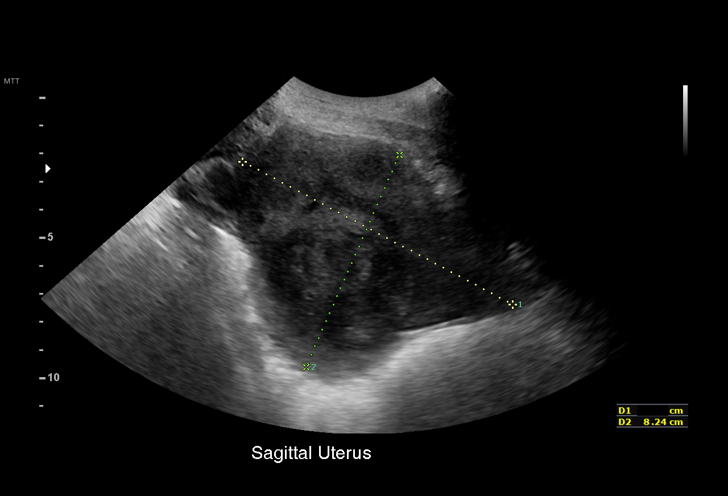
[im 13/156]
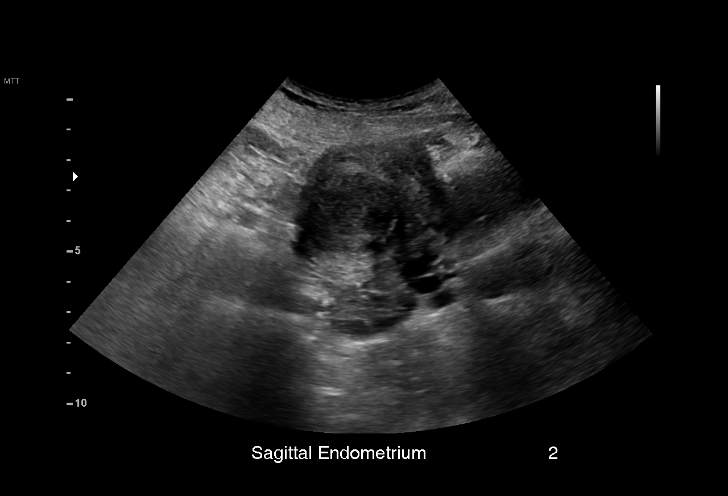
[im 26/156]
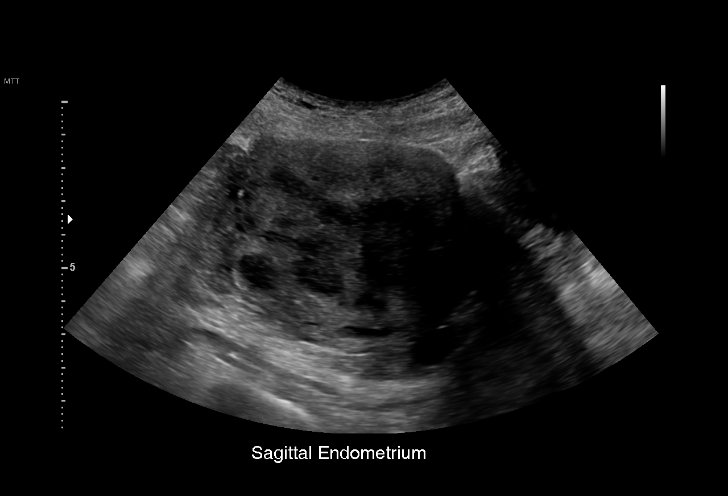
[im 33/156]
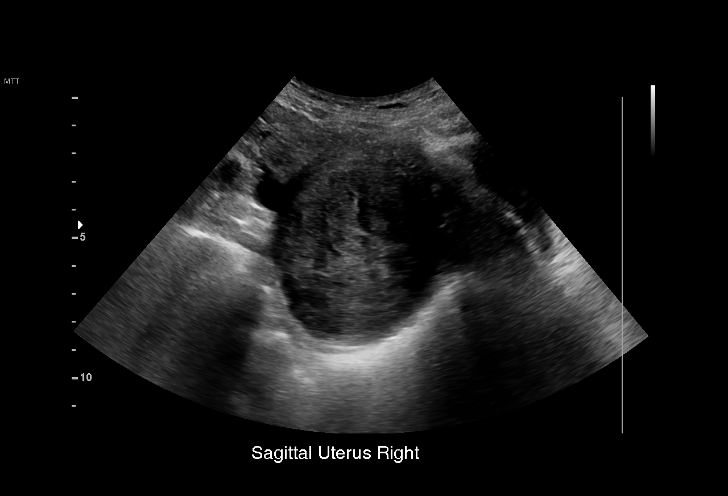
[im 46/156]
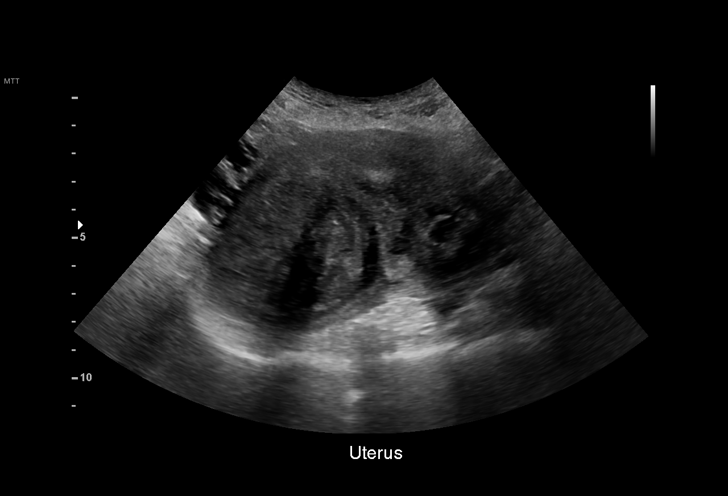
[im 59/156]
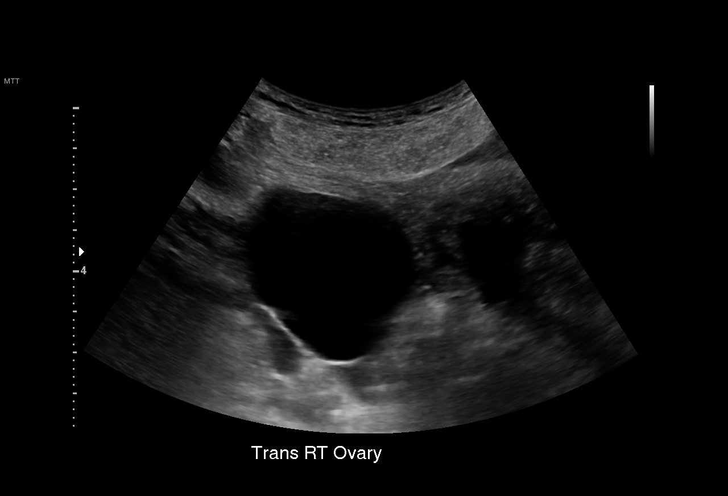
[im 65/156]
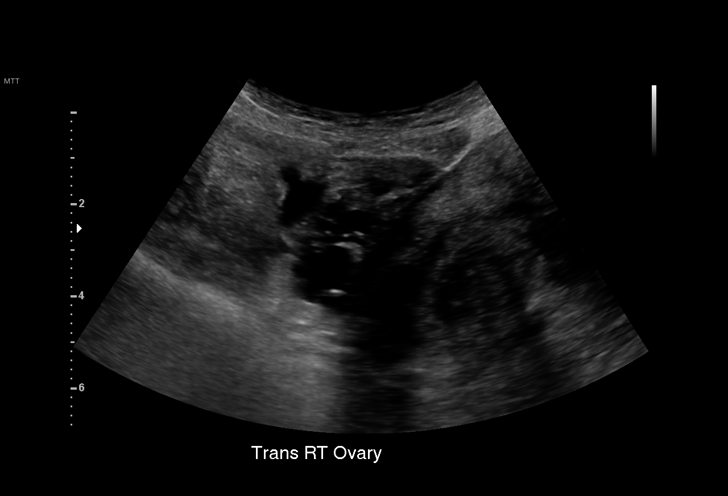
[im 78/156]
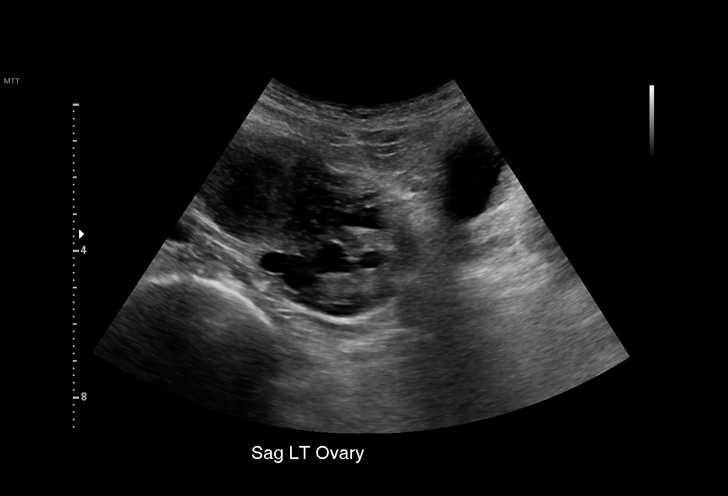
[im 91/156]
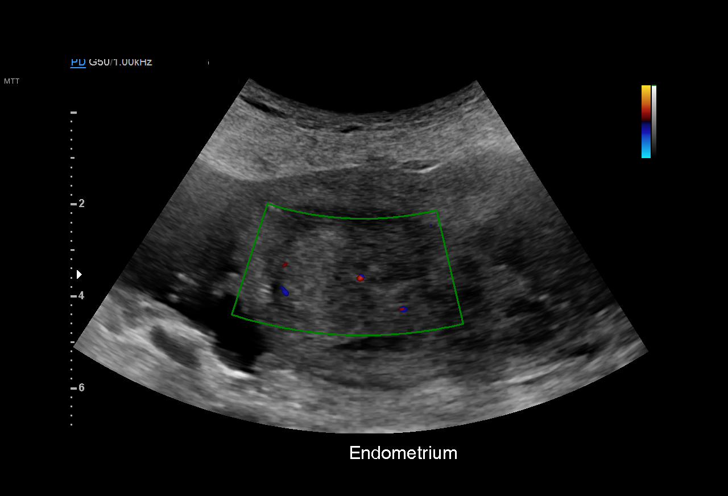
[im 97/156]
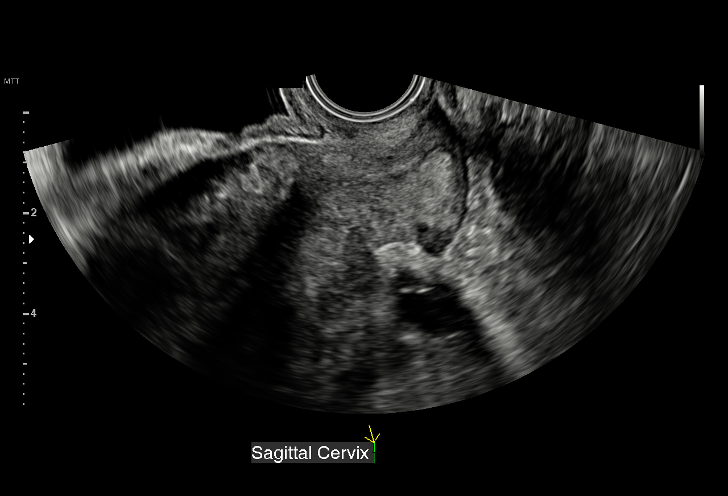
[im 110/156]
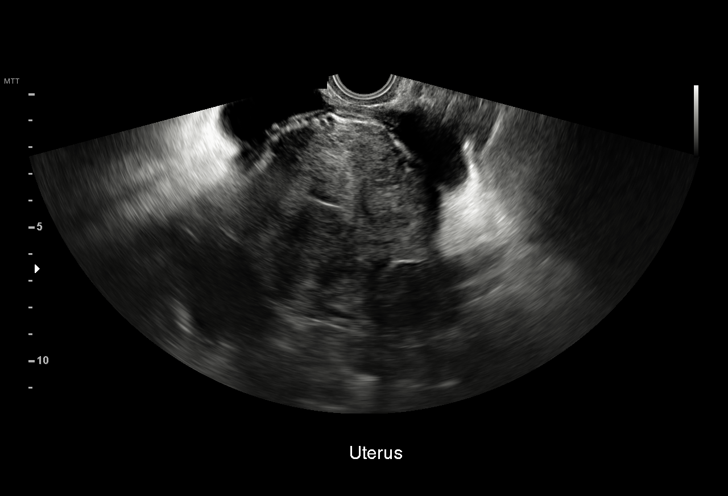
[im 123/156]
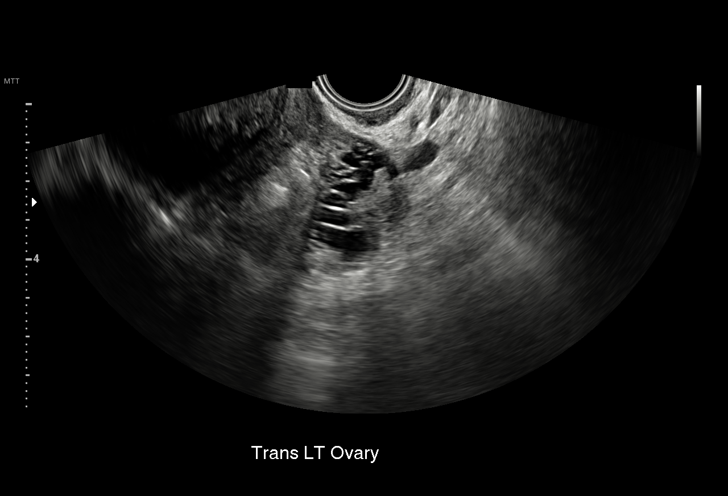
[im 130/156]
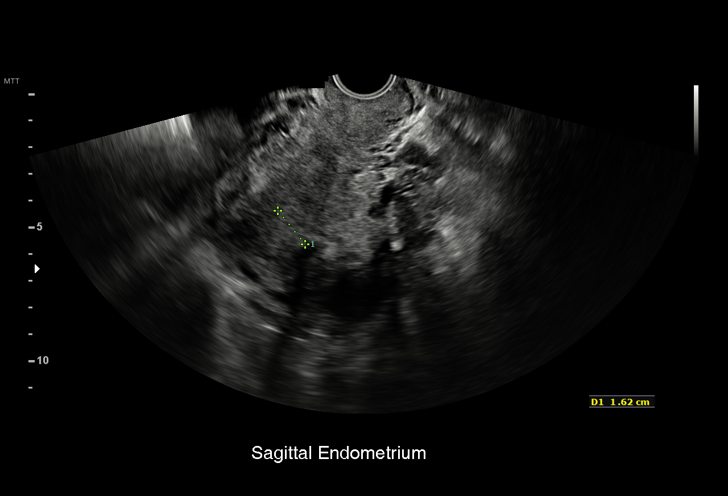
[im 143/156]
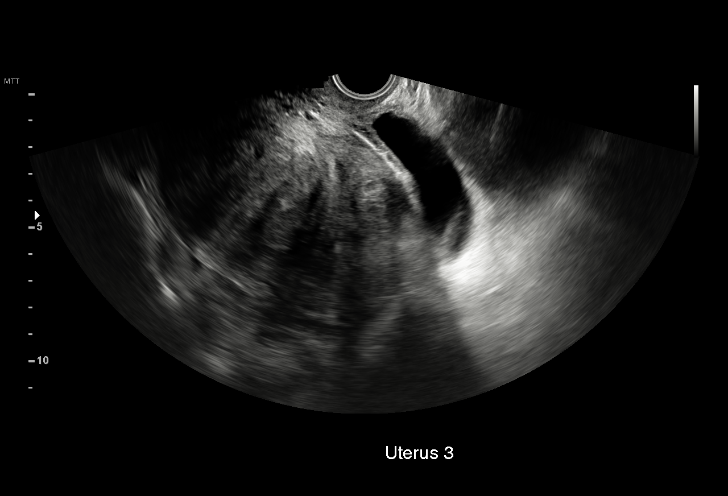
[im 156/156]
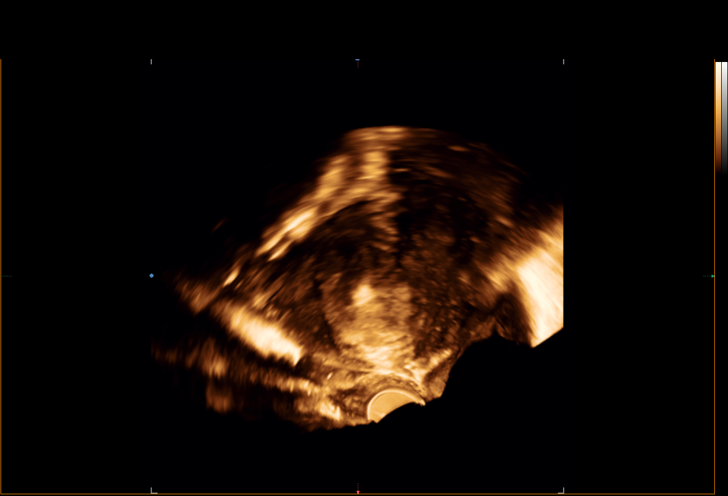

[15 of 25 positions shown; findings below may reference images not displayed]

FINDINGS: Uterus

Measurements: 11.7 x 6.6 x 011.2 cm. Multiple fibroids, the largest
in the posterior right body measuring up to 6.8 cm

Endometrium

Thickness: Thickened, measuring 20 mm. 2.2 x 2.2 x 1.9 cm
hyperechoic area noted within the endometrium, only seen on
transabdominal imaging

Right ovary

Measurements: 7.0 x 3.9 x 4.9 cm. 6.0 x 4.8 x 3.6 cm cyst within the
right ovary which appears benign. There may be a thin peripheral
septation. No internal blood flow.

Left ovary

Measurements: 3.9 x 2.3 x 2.6 cm.. Normal appearance/no adnexal
mass.

Other findings

Small amount of free fluid.
IMPRESSION: Multiple uterine fibroids, the largest measuring up to 6.8 cm.

Endometrium is thickened at 20 mm with focal hyperechoic area
measured up to 2.2 cm. This could reflect pedunculated submucosal
fibroid, polyp or other endometrial mass. Consider further
evaluation with sonohysterogram or hysteroscopy.

## 2018-08-17 ENCOUNTER — Ambulatory Visit: Payer: Medicaid Other

## 2018-09-15 ENCOUNTER — Ambulatory Visit: Payer: Medicaid Other | Admitting: Certified Nurse Midwife

## 2018-09-24 ENCOUNTER — Encounter

## 2018-10-28 ENCOUNTER — Ambulatory Visit: Payer: Medicaid Other | Admitting: Certified Nurse Midwife

## 2018-11-09 ENCOUNTER — Other Ambulatory Visit: Payer: Self-pay

## 2018-11-09 DIAGNOSIS — Z20822 Contact with and (suspected) exposure to covid-19: Secondary | ICD-10-CM

## 2018-11-10 LAB — NOVEL CORONAVIRUS, NAA: SARS-CoV-2, NAA: NOT DETECTED

## 2018-11-17 ENCOUNTER — Other Ambulatory Visit: Payer: Self-pay

## 2018-11-17 ENCOUNTER — Ambulatory Visit
Admission: RE | Admit: 2018-11-17 | Discharge: 2018-11-17 | Disposition: A | Discharge status: Home / Self Care | Payer: Medicaid Other | Source: Ambulatory Visit | Attending: Family Medicine | Admitting: Family Medicine

## 2018-11-17 DIAGNOSIS — Z1231 Encounter for screening mammogram for malignant neoplasm of breast: Secondary | ICD-10-CM

## 2018-12-13 ENCOUNTER — Ambulatory Visit: Payer: Medicaid Other | Admitting: Obstetrics

## 2018-12-24 ENCOUNTER — Other Ambulatory Visit: Payer: Self-pay

## 2018-12-24 DIAGNOSIS — Z20822 Contact with and (suspected) exposure to covid-19: Secondary | ICD-10-CM

## 2018-12-27 LAB — NOVEL CORONAVIRUS, NAA: SARS-CoV-2, NAA: NOT DETECTED

## 2018-12-28 ENCOUNTER — Telehealth: Payer: Self-pay | Admitting: General Practice

## 2018-12-28 NOTE — Telephone Encounter (Signed)
Gave guardian of patient negative covid test results

## 2019-01-28 ENCOUNTER — Other Ambulatory Visit: Payer: Self-pay

## 2019-01-28 ENCOUNTER — Other Ambulatory Visit (HOSPITAL_COMMUNITY)
Admission: RE | Admit: 2019-01-28 | Discharge: 2019-01-28 | Disposition: A | Discharge status: Home / Self Care | Payer: Medicaid Other | Source: Ambulatory Visit | Attending: Obstetrics | Admitting: Obstetrics

## 2019-01-28 ENCOUNTER — Ambulatory Visit: Payer: Medicaid Other | Admitting: Obstetrics

## 2019-01-28 ENCOUNTER — Encounter: Payer: Self-pay | Admitting: Obstetrics

## 2019-01-28 VITALS — BP 143/96 | HR 90 | Wt 120.0 lb

## 2019-01-28 DIAGNOSIS — R9389 Abnormal findings on diagnostic imaging of other specified body structures: Secondary | ICD-10-CM

## 2019-01-28 DIAGNOSIS — Z0000 Encounter for general adult medical examination without abnormal findings: Secondary | ICD-10-CM | POA: Diagnosis not present

## 2019-01-28 DIAGNOSIS — Z01419 Encounter for gynecological examination (general) (routine) without abnormal findings: Secondary | ICD-10-CM | POA: Insufficient documentation

## 2019-01-28 DIAGNOSIS — F79 Unspecified intellectual disabilities: Secondary | ICD-10-CM

## 2019-01-28 DIAGNOSIS — N898 Other specified noninflammatory disorders of vagina: Secondary | ICD-10-CM

## 2019-01-28 DIAGNOSIS — Z975 Presence of (intrauterine) contraceptive device: Secondary | ICD-10-CM

## 2019-01-28 NOTE — Progress Notes (Signed)
Subjective:        [name] is a 48 y.o. female here for a routine exam.  Current complaints: None.  The patient has a mental impairment, and her mother is accompanying her to aid in communication.   Personal health questionnaire:  Is patient Ashkenazi Jewish, have a family history of breast and/or ovarian cancer: no Is there a family history of uterine cancer diagnosed at age < 50, gastrointestinal cancer, urinary tract cancer, family member who is a Field seismologist syndrome-associated carrier: no Is the patient overweight and hypertensive, family history of diabetes, personal history of gestational diabetes, preeclampsia or PCOS: no Is patient over 51, have PCOS,  family history of premature CHD under age 67, diabetes, smoke, have hypertension or peripheral artery disease:  no At any time, has a partner hit, kicked or otherwise hurt or frightened you?: no Over the past 2 weeks, have you felt down, depressed or hopeless?: no Over the past 2 weeks, have you felt little interest or pleasure in doing things?:no   Gynecologic History No LMP recorded (lmp unknown). Patient is postmenopausal. Contraception: IUD Last Pap: 04-02-2016. Results were: normal Last mammogram: 11-17-2018. Results were: normal  Obstetric History OB History  Gravida Para Term Preterm AB Living  0 0 0 0 0 0  SAB TAB Ectopic Multiple Live Births  0 0 0 0 0    Past Medical History:  Diagnosis Date  . Fibroids   . Mental disorder   . 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 Pneumonia     Past Surgical History:  Procedure Laterality Date  . thumb surgery       Current Outpatient Medications:  .  aspirin EC 81 MG tablet, Take 81 mg by mouth daily as needed for mild pain. , Disp: , Rfl:  .  levonorgestrel (MIRENA) 20 MCG/24HR IUD, 1 each by Intrauterine route once., Disp: , Rfl:  .  Multiple Vitamin (MULTIVITAMIN) capsule, Take 1 capsule by mouth daily., Disp: , Rfl:  No Known Allergies  Social History    Tobacco Use  . Smoking status: Never Smoker  . Smokeless tobacco: Never Used  Substance Use Topics  . Alcohol use: No    Alcohol/week: 0.0 standard drinks    Family History  Problem Relation Age of Onset  . Breast cancer Mother   . Breast cancer Maternal Aunt 57      Review of Systems  Constitutional: negative for fatigue and weight loss Respiratory: negative for cough and wheezing Cardiovascular: negative for chest pain, fatigue and palpitations Gastrointestinal: negative for abdominal pain and change in bowel habits Musculoskeletal:negative for myalgias Neurological: negative for gait problems and tremors Behavioral/Psych: negative for abusive relationship, depression Endocrine: negative for temperature intolerance    Genitourinary:negative for abnormal menstrual periods, genital lesions, hot flashes, sexual problems and vaginal discharge Integument/breast: negative for breast lump, breast tenderness, nipple discharge and skin lesion(s)    Objective:       BP (!) 143/96   Pulse 90   Wt 120 lb (54.4 kg)   LMP  (LMP Unknown)   BMI 21.95 kg/m  General:   alert  Skin:   no rash or abnormalities  Lungs:   clear to auscultation bilaterally  Heart:   regular rate and rhythm, S1, S2 normal, no murmur, click, rub or gallop  Breasts:   normal without suspicious masses, skin or nipple changes or axillary nodes  Abdomen:  normal findings: no organomegaly, soft, non-tender and no hernia  Pelvis:  External genitalia: normal general appearance Urinary system: urethral meatus normal and bladder without fullness, nontender Vaginal: normal without tenderness, induration or masses Cervix: normal appearance Adnexa: normal bimanual exam Uterus: anteverted and non-tender, normal size   Lab Review Urine pregnancy test Labs reviewed yes Radiologic studies reviewed yes  50% of 25 min visit spent on counseling and coordination of care.   Assessment:     1. Encounter for routine  gynecological examination with Papanicolaou smear of cervix Rx: - Cytology - PAP( )  2. Vaginal discharge Rx: - Cervicovaginal ancillary only( [place])  3. IUD check up - IUD string not visible - managed by Dr. Zigmund Daniel  4. Mental impairment - mother accompanies her for aid with communication    Plan:    Education reviewed: calcium supplements, depression evaluation, low fat, low cholesterol diet, safe sex/STD prevention, self breast exams and weight bearing exercise. Contraception: IUD. Follow up in: 1 year.   No orders of the defined types were placed in this encounter.  No orders of the defined types were placed in this encounter.   Shelly Bombard, MD 01/28/2019 12:00 PM

## 2019-01-31 LAB — CERVICOVAGINAL ANCILLARY ONLY
Bacterial Vaginitis (gardnerella): POSITIVE — AB
Candida Glabrata: NEGATIVE
Candida Vaginitis: NEGATIVE
Chlamydia: NEGATIVE
Comment: NEGATIVE
Comment: NEGATIVE
Comment: NEGATIVE
Comment: NEGATIVE
Comment: NEGATIVE
Comment: NORMAL
Neisseria Gonorrhea: NEGATIVE
Trichomonas: NEGATIVE

## 2019-01-31 LAB — CYTOLOGY - PAP
Comment: NEGATIVE
Diagnosis: NEGATIVE
High risk HPV: NEGATIVE

## 2019-02-01 ENCOUNTER — Telehealth: Payer: Self-pay

## 2019-02-01 ENCOUNTER — Other Ambulatory Visit: Payer: Self-pay | Admitting: Obstetrics

## 2019-02-01 DIAGNOSIS — B9689 Other specified bacterial agents as the cause of diseases classified elsewhere: Secondary | ICD-10-CM

## 2019-02-01 DIAGNOSIS — N760 Acute vaginitis: Secondary | ICD-10-CM

## 2019-02-01 MED ORDER — TINIDAZOLE 500 MG PO TABS: 1000.0000 mg | ORAL_TABLET | Freq: Every day | ORAL | 2 refills | Status: DC

## 2019-02-01 NOTE — Telephone Encounter (Signed)
-----   Message from Shelly Bombard, MD sent at 02/01/2019  8:15 AM EST ----- Tindamax Rx for BV

## 2019-02-01 NOTE — Telephone Encounter (Signed)
Attempted to contact pt to make aware of results pt not ava

## 2019-02-07 ENCOUNTER — Telehealth: Payer: Self-pay | Admitting: *Deleted

## 2019-02-07 NOTE — Telephone Encounter (Signed)
PA for Tinidazole has been submitted and approved.  Pharmacy aware.

## 2019-11-23 ENCOUNTER — Other Ambulatory Visit: Payer: Self-pay | Admitting: Family Medicine

## 2019-11-23 DIAGNOSIS — Z1231 Encounter for screening mammogram for malignant neoplasm of breast: Secondary | ICD-10-CM

## 2019-12-08 ENCOUNTER — Ambulatory Visit: Payer: Medicaid Other

## 2020-02-09 ENCOUNTER — Ambulatory Visit
Admission: RE | Admit: 2020-02-09 | Discharge: 2020-02-09 | Disposition: A | Discharge status: Home / Self Care | Payer: Medicaid Other | Source: Ambulatory Visit | Attending: Family Medicine | Admitting: Family Medicine

## 2020-02-09 ENCOUNTER — Other Ambulatory Visit: Payer: Self-pay

## 2020-02-09 DIAGNOSIS — Z1231 Encounter for screening mammogram for malignant neoplasm of breast: Secondary | ICD-10-CM

## 2020-06-26 ENCOUNTER — Other Ambulatory Visit: Payer: Self-pay

## 2020-06-26 ENCOUNTER — Other Ambulatory Visit (HOSPITAL_COMMUNITY)
Admission: RE | Admit: 2020-06-26 | Discharge: 2020-06-26 | Disposition: A | Discharge status: Home / Self Care | Payer: Medicaid Other | Source: Ambulatory Visit | Attending: Obstetrics | Admitting: Obstetrics

## 2020-06-26 ENCOUNTER — Ambulatory Visit (INDEPENDENT_AMBULATORY_CARE_PROVIDER_SITE_OTHER): Payer: Medicaid Other | Admitting: Obstetrics

## 2020-06-26 ENCOUNTER — Encounter: Payer: Self-pay | Admitting: Obstetrics

## 2020-06-26 VITALS — BP 134/90 | HR 72 | Ht 65.0 in | Wt 124.0 lb

## 2020-06-26 DIAGNOSIS — Z01419 Encounter for gynecological examination (general) (routine) without abnormal findings: Secondary | ICD-10-CM | POA: Diagnosis not present

## 2020-06-26 DIAGNOSIS — Z124 Encounter for screening for malignant neoplasm of cervix: Secondary | ICD-10-CM | POA: Diagnosis not present

## 2020-06-26 DIAGNOSIS — G7100 Muscular dystrophy, unspecified: Secondary | ICD-10-CM | POA: Diagnosis not present

## 2020-06-26 DIAGNOSIS — Z975 Presence of (intrauterine) contraceptive device: Secondary | ICD-10-CM | POA: Diagnosis not present

## 2020-06-26 DIAGNOSIS — D251 Intramural leiomyoma of uterus: Secondary | ICD-10-CM

## 2020-06-26 NOTE — Progress Notes (Signed)
Patient presents for Annual Exam .  Contraception:IUD Mammogram:02/09/20 STD Screening: Declined  Last pap: 01/28/19 WNL   Family Hx of Breast Cancer:M. Aunt.   Wants to discuss Annual Labs since missed last yr.    CC: weight gain and wants to be sure IUD is in place.

## 2020-06-26 NOTE — Progress Notes (Signed)
Subjective:        [name] is a 50 y.o. female here for a routine exam.  Current complaints: None.    Personal health questionnaire:  Is patient Ashkenazi Jewish, have a family history of breast and/or ovarian cancer: yes Is there a family history of uterine cancer diagnosed at age < 80, gastrointestinal cancer, urinary tract cancer, family member who is a Field seismologist syndrome-associated carrier: no Is the patient overweight and hypertensive, family history of diabetes, personal history of gestational diabetes, preeclampsia or PCOS: no Is patient over 87, have PCOS,  family history of premature CHD under age 70, diabetes, smoke, have hypertension or peripheral artery disease:  no At any time, has a partner hit, kicked or otherwise hurt or frightened you?: no Over the past 2 weeks, have you felt down, depressed or hopeless?: no Over the past 2 weeks, have you felt little interest or pleasure in doing things?:no   Gynecologic History No LMP recorded (lmp unknown). Patient is postmenopausal. Contraception: IUD Last Pap: 2020. Results were: normal Last mammogram: 02-09-2020. Results were: normal  Obstetric History OB History  Gravida Para Term Preterm AB Living  0 0 0 0 0 0  SAB IAB Ectopic Multiple Live Births  0 0 0 0 0    Past Medical History:  Diagnosis Date  . Fibroids   . Mental disorder   . 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 Pneumonia     Past Surgical History:  Procedure Laterality Date  . thumb surgery       Current Outpatient Medications:  .  aspirin EC 81 MG tablet, Take 81 mg by mouth daily as needed for mild pain. , Disp: , Rfl:  .  levonorgestrel (MIRENA) 20 MCG/24HR IUD, 1 each by Intrauterine route once., Disp: , Rfl:  .  Multiple Vitamin (MULTIVITAMIN) capsule, Take 1 capsule by mouth daily., Disp: , Rfl:  .  tinidazole (TINDAMAX) 500 MG tablet, Take 2 tablets (1,000 mg total) by mouth daily with breakfast., Disp: 10 tablet, Rfl: 2 No Known  Allergies  Social History   Tobacco Use  . Smoking status: Never Smoker  . Smokeless tobacco: Never Used  Substance Use Topics  . Alcohol use: No    Alcohol/week: 0.0 standard drinks    Family History  Problem Relation Age of Onset  . Breast cancer Mother   . Breast cancer Maternal Aunt 57      Review of Systems  Constitutional: negative for fatigue and weight loss Respiratory: negative for cough and wheezing Cardiovascular: negative for chest pain, fatigue and palpitations Gastrointestinal: negative for abdominal pain and change in bowel habits Musculoskeletal:negative for myalgias Neurological: negative for gait problems and tremors Behavioral/Psych: negative for abusive relationship, depression Endocrine: negative for temperature intolerance    Genitourinary:negative for abnormal menstrual periods, genital lesions, hot flashes, sexual problems and vaginal discharge Integument/breast: negative for breast lump, breast tenderness, nipple discharge and skin lesion(s)    Objective:       BP 134/90   Pulse 72   Ht 5\' 5"  (1.651 m)   Wt 124 lb (56.2 kg)   LMP  (LMP Unknown)   BMI 20.63 kg/m  General:   alert and no distress  Skin:   no rash or abnormalities  Lungs:   clear to auscultation bilaterally  Heart:   regular rate and rhythm, S1, S2 normal, no murmur, click, rub or gallop  Breasts:   normal without suspicious masses, skin or nipple changes or  axillary nodes  Abdomen:  normal findings: no organomegaly, soft, non-tender and no hernia  Pelvis:  External genitalia: normal general appearance Urinary system: urethral meatus normal and bladder without fullness, nontender Vaginal: normal without tenderness, induration or masses Cervix: normal appearance Adnexa: normal bimanual exam Uterus: anteverted and non-tender, normal size   Lab Review Urine pregnancy test Labs reviewed yes Radiologic studies reviewed yes  I have spent a total of 20 minutes of face-to-face  time, excluding clinical staff time, reviewing notes and preparing to see patient, ordering tests and/or medications, and counseling the patient.  Assessment:     1. Encounter for routine gynecological examination with Papanicolaou smear of cervix - doing well  2. History of fibroids - clinically stable with IUD in place  3. IUD (intrauterine device) in place - Mirena ( inserted 08-28-2016 )  4. Muscular dystrophy ([place]) - clinically stable    Plan:    Education reviewed: calcium supplements, depression evaluation, low fat, low cholesterol diet, safe sex/STD prevention, self breast exams and weight bearing exercise. Contraception: IUD. Follow up in: 1 year.    Shelly Bombard, MD 06/26/2020 11:51 AM

## 2020-07-03 LAB — CYTOLOGY - PAP
Comment: NEGATIVE
Diagnosis: NEGATIVE
High risk HPV: NEGATIVE

## 2020-09-07 ENCOUNTER — Encounter: Payer: Self-pay | Admitting: Cardiology

## 2020-09-07 ENCOUNTER — Ambulatory Visit: Payer: Self-pay | Admitting: Cardiology

## 2020-09-07 NOTE — Progress Notes (Deleted)
Primary Physician/Referring:  Katherina Mires, MD  Patient ID: [name], female    DOB: 1970-02-28, 50 y.o.   MRN: [id number]  No chief complaint on file.  HPI:    [name]  is a 50 y.o. 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  ***  Past Medical History:  Diagnosis Date   Fibroids    Mental disorder    Myotonic dystrophy, type 1 ([place]) 05/20/2017   Past Surgical History:  Procedure Laterality Date   thumb surgery     Family History  Problem Relation Age of Onset   Breast cancer Mother    Breast cancer Maternal Aunt 57    Social History   Tobacco Use   Smoking status: Never   Smokeless tobacco: Never  Substance Use Topics   Alcohol use: No    Alcohol/week: 0.0 standard drinks   Marital Status: Single  ROS  ***ROS Objective  There were no vitals taken for this visit. There is no height or weight on file to calculate BMI.  Vitals with BMI 06/26/2020 01/28/2019 09/10/2017  Height '5\' 5"'  - -  Weight 124 lbs 120 lbs -  BMI 59.56 - -  Systolic 387 564 332  Diastolic 90 96 87  Pulse 72 90 71     ***Physical Exam   Laboratory examination:   External labs:   Labs 07/26/2020:  Total cholesterol 223, triglycerides 95, HDL 67, LDL 139.  Serum glucose 98 mg, BUN 12, creatinine 0.68, EGFR 105 mill, sodium 146, potassium 4.9.  CMP otherwise normal.  Hb 13.3/HCT 41.0, platelets 245.  TSH normal at 1.590  Genetic testing DMPK trinucleotide analysis 10/09/2017: Analysis indicates an abnormal pattern upon Southern blot testing with evidence of trinucleotide repeat expansion in the myotonic  dystrophy gene.  The CTG repeat numbers in each allele of the myotonic dystrophy gene were 5 and greater than approximately 1200, respectively.  Further analysis of other family members may be warranted.  Genetic counseling is recommended to ensure the best possible care for the patient and associated family members.   Medications and allergies   No Known Allergies   Medication prior to this encounter:   Outpatient Medications Prior to Visit  Medication Sig Dispense Refill   aspirin EC 81 MG tablet Take 81 mg by mouth daily as needed for mild pain.      levonorgestrel (MIRENA) 20 MCG/24HR IUD 1 each by Intrauterine route once.     Multiple Vitamin (MULTIVITAMIN) capsule Take 1 capsule by mouth daily.     tinidazole (TINDAMAX) 500 MG tablet Take 2 tablets (1,000 mg total) by mouth daily with breakfast. 10 tablet 2   No facility-administered medications prior to visit.     Medication list after today's encounter   Current Outpatient Medications  Medication Instructions   aspirin EC 81 mg, Oral, Daily PRN   levonorgestrel (MIRENA) 20 MCG/24HR IUD 1 each, Intrauterine,  Once   Multiple Vitamin (MULTIVITAMIN) capsule 1 capsule, Oral, Daily   tinidazole (TINDAMAX) 1,000 mg, Oral, Daily with breakfast    Radiology:   No results found.  Cardiac Studies:   Cardiac MRI 05/02/2014: Study  quality affected by patient's poor coordination and motion artifact.   Normal LV size, thickness, and systolic function with EF 60-65%.  No WMA.  No late gadolinium enhancement was seen in LV myocardium.   Mildly dilated right atrium.   Mild tricuspid regurgitation.   There is a suspicion for valve thickening versus mass on the right ventricular side of the tricuspid valve measuring 24 x 14 mm.   A TEE is recommended for further evaluation.  Trivial MR.  Echocardiogram 05/07/2017: Normal LV size, normal global wall motion, normal diastolic filling.  Calculated LVEF 56%. Mild tricuspid regurgitation,  estimated PA systolic pressure 22 mmHg. There is a globular, homogenous echodense structure measuring 1.2 x 1.1 cm attached to the lateral tricuspid leaflet.  This appears unchanged compared to prior studies in 2016.  Differentials include lipoma versus fibroblastoma.  Holter monitor 24 hours 05/07/2017:  No symptoms reported.  Rare PACs.  There are frequent but insignificant number, 6000 PVCs in 24 hours.  Occasional ventricular couplets and ventricular couplets noted.  EKG:   ***  EKG 05/06/2017: Normal sinus rhythm at rate of 80 bpm, left axis deviation, left anterior fascicular block. Incomplete right branch block. Poor R progression, cannot exclude anteroseptal infarct old. Nonspecific T abnormality. Normal QT interval. No significant change from EKG 04/25/2014.  Assessment     ICD-10-CM   1. Tricuspid valve mass  I07.8     2. Myotonic dystrophy, type 1 ([place])  G71.11     3. PVC (premature ventricular contraction)  I49.3        There are no discontinued medications.  No orders of the defined types were placed in this encounter.  No orders of the defined types were placed in this encounter.  Recommendations:   [name] is a 50 y.o. 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  ***   ** Myotonic dystrophy: Type 1 and 2: These autosomal dominant conditions are among the most common forms of adult-onset muscular dystrophy. However, DM is more than simply a muscular dystrophy per se, since affected individuals may show cataracts, cardiac conduction abnormalities, infertility, and insulin resistance.  Furthermore,there is a severe congenital form of DM1 with marked developmental disability)      Adrian Prows, MD, [facility] 09/07/2020, 5:52 AM Office: [phone]

## 2020-10-12 ENCOUNTER — Encounter (HOSPITAL_COMMUNITY): Payer: Self-pay | Admitting: Emergency Medicine

## 2020-10-12 ENCOUNTER — Emergency Department (HOSPITAL_COMMUNITY)
Admission: EM | Admit: 2020-10-12 | Discharge: 2020-10-12 | Disposition: A | Discharge status: Home / Self Care | Payer: Medicaid Other | Attending: Emergency Medicine | Admitting: Emergency Medicine

## 2020-10-12 ENCOUNTER — Emergency Department (HOSPITAL_COMMUNITY): Payer: Medicaid Other

## 2020-10-12 ENCOUNTER — Other Ambulatory Visit: Payer: Self-pay

## 2020-10-12 DIAGNOSIS — S8991XA Unspecified injury of right lower leg, initial encounter: Secondary | ICD-10-CM | POA: Diagnosis present

## 2020-10-12 DIAGNOSIS — W101XXA Fall (on)(from) sidewalk curb, initial encounter: Secondary | ICD-10-CM | POA: Diagnosis not present

## 2020-10-12 DIAGNOSIS — Y92480 Sidewalk as the place of occurrence of the external cause: Secondary | ICD-10-CM | POA: Diagnosis not present

## 2020-10-12 DIAGNOSIS — S8001XA Contusion of right knee, initial encounter: Secondary | ICD-10-CM | POA: Diagnosis not present

## 2020-10-12 DIAGNOSIS — Z7982 Long term (current) use of aspirin: Secondary | ICD-10-CM | POA: Diagnosis not present

## 2020-10-12 NOTE — ED Provider Notes (Signed)
[place] DEPT Provider Note   CSN: [id number] Arrival date & time: 10/12/20  1111     History Chief Complaint  Patient presents with   Knee Pain   Fall    [name] is a 50 y.o. female.  The history is provided by the patient. No language interpreter was used.  Knee Pain Location:  Knee Time since incident:  1 day Knee location:  R knee Pain details:    Quality:  Aching   Radiates to:  Does not radiate   Onset quality:  Gradual   Duration:  1 day   Timing:  Constant   Progression:  Worsening Chronicity:  New Relieved by:  Nothing Worsened by:  Nothing Ineffective treatments:  None tried Fall Pt hit knee on concrete.  Pt has difficulty walking      Past Medical History:  Diagnosis Date   Fibroids    Mental disorder    Myotonic dystrophy, type 1 ([place]) 05/20/2017    Patient Active Problem List   Diagnosis Date Noted   Myotonic dystrophy, type 1 ([place]) 05/20/2017   Cyst of right ovary 04/29/2016   Thickened endometrium 04/29/2016   Mental impairment 07/14/2014    Past Surgical History:  Procedure Laterality Date   thumb surgery       OB History     Gravida  0   Para  0   Term  0   Preterm  0   AB  0   Living  0      SAB  0   IAB  0   Ectopic  0   Multiple  0   Live Births  0           Family History  Problem Relation Age of Onset   Breast cancer Mother    Breast cancer Maternal Aunt 57    Social History   Tobacco Use   Smoking status: Never   Smokeless tobacco: Never  Vaping Use   Vaping Use: Never used  Substance Use Topics   Alcohol use: No    Alcohol/week: 0.0 standard drinks   Drug use: No    Home Medications Prior to Admission medications   Medication Sig Start Date End Date Taking? Authorizing Provider  aspirin EC 81 MG tablet Take 81 mg by mouth daily as needed for mild pain.     [provider]  levonorgestrel (MIRENA) 20 MCG/24HR IUD 1 each by Intrauterine  route once.    [provider]  Multiple Vitamin (MULTIVITAMIN) capsule Take 1 capsule by mouth daily.    [provider]  tinidazole (TINDAMAX) 500 MG tablet Take 2 tablets (1,000 mg total) by mouth daily with breakfast. 02/01/19   Shelly Bombard, MD    Allergies    Patient has no known allergies.  Review of Systems   Review of Systems  All other systems reviewed and are negative.  Physical Exam Updated Vital Signs BP 107/87   Pulse 76   Temp 98.4 F (36.9 C) (Oral)   Resp 18   LMP  (LMP Unknown)   SpO2 97%   Physical Exam Vitals and nursing note reviewed.  Constitutional:      Appearance: She is well-developed.  HENT:     Head: Normocephalic.  Pulmonary:     Effort: Pulmonary effort is normal.  Abdominal:     General: There is no distension.  Musculoskeletal:        General: Swelling and tenderness  present.     Cervical back: Normal range of motion.  Skin:    General: Skin is warm.     Capillary Refill: Capillary refill takes less than 2 seconds.  Neurological:     Mental Status: She is alert and oriented to person, place, and time.  Psychiatric:        Mood and Affect: Mood normal.    ED Results / Procedures / Treatments   Labs (all labs ordered are listed, but only abnormal results are displayed) Labs Reviewed - No data to display  EKG None  Radiology DG Knee Complete 4 Views Right  Result Date: 10/12/2020 CLINICAL DATA:  Diffuse knee pain after a fall yesterday. EXAM: RIGHT KNEE - COMPLETE 4+ VIEW COMPARISON:  10/29/2012 FINDINGS: No evidence for fracture or dislocation. No worrisome lytic or sclerotic osseous abnormality. Tiny joint effusion noted. IMPRESSION: No acute bony abnormality. Electronically Signed   By: Misty Stanley M.D.   On: 10/12/2020 12:22    Procedures Procedures   Medications Ordered in ED Medications - No data to display  ED Course  I have reviewed the triage vital signs and the nursing notes.  Pertinent  labs & imaging results that were available during my care of the patient were reviewed by me and considered in my medical decision making (see chart for details).    MDM Rules/Calculators/A&P                           MDM:  Pt placed in a knee immbolizer.  Pt advised to follow up with primary care for recheck next week  Final Clinical Impression(s) / ED Diagnoses Final diagnoses:  Contusion of right knee, initial encounter    Rx / DC Orders ED Discharge Orders     None     An After Visit Summary was printed and given to the patient.    Fransico Meadow, PA-C 10/12/20 [place], [place], DO 10/13/20 1907

## 2020-10-12 NOTE — Discharge Instructions (Addendum)
See your Physician for recheck next week  °

## 2020-10-12 NOTE — ED Triage Notes (Signed)
Patient presents post fall on the pavement in which she landed on her right knee. Since she has experienced knee pain and trouble weightbearing on that knee.

## 2020-11-30 ENCOUNTER — Other Ambulatory Visit: Payer: Self-pay

## 2020-11-30 ENCOUNTER — Encounter: Payer: Self-pay | Admitting: Cardiology

## 2020-11-30 ENCOUNTER — Ambulatory Visit: Payer: Medicaid Other | Admitting: Cardiology

## 2020-11-30 VITALS — BP 136/85 | HR 78 | Temp 98.5°F | Resp 16 | Ht 65.0 in | Wt 126.4 lb

## 2020-11-30 DIAGNOSIS — I078 Other rheumatic tricuspid valve diseases: Secondary | ICD-10-CM

## 2020-11-30 DIAGNOSIS — R9431 Abnormal electrocardiogram [ECG] [EKG]: Secondary | ICD-10-CM

## 2020-11-30 NOTE — Progress Notes (Signed)
Primary Physician/Referring:  Katherina Mires, MD  Patient ID: [name], female    DOB: 1970/06/11, 50 y.o.   MRN: [id number]  Chief Complaint  Patient presents with   Myotonic dystrophy   tricuspid valve mass   New Patient (Initial Visit)    Referred by Daisy Lazar   HPI:    [name]  is a 50 y.o. 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 Patient was referred back to me by neurology for frequent ectopy. Due to abnormal gait, she has been followed by Neurology and was diagnosed yesterday with muscular dystrophy.  I had last seen her in April 2019.  She has no specific complaints today.  Patient is able to verbalize.  Past Medical History:  Diagnosis Date   Fibroids    Mental disorder    Myotonic dystrophy, type 1 ([place]) 05/20/2017   Past Surgical History:  Procedure Laterality Date   thumb surgery     Family History  Problem Relation Age of Onset   Breast cancer Mother    Breast cancer Maternal Aunt 57    Social History   Tobacco Use   Smoking status: Never   Smokeless tobacco: Never  Substance Use Topics   Alcohol use: No    Alcohol/week: 0.0 standard drinks   Marital Status: Single  ROS  Review of Systems  Cardiovascular:  Negative for chest pain, dyspnea on exertion and leg swelling.  Gastrointestinal:  Negative for melena.  Objective  Blood pressure 136/85, pulse 78, temperature 98.5 F (36.9 C), temperature source Temporal, resp. rate 16, height 5\' 5"  (1.651 m), weight 126 lb 6.4 oz (57.3 kg), SpO2 98 %. Body mass index is 21.03 kg/m.  Vitals with BMI 11/30/2020 10/12/2020 10/12/2020  Height 5\' 5"  - -  Weight 126 lbs 6 oz - -  BMI 28.76 - -  Systolic 811 572 620  Diastolic 85 87 85  Pulse 78 76 79     Physical Exam Neck:     Vascular: No carotid bruit or JVD.  Cardiovascular:      Rate and Rhythm: Normal rate and regular rhythm.     Pulses: Intact distal pulses.     Heart sounds: A midsystolic click (left parasternal). Murmur heard.  Mid to late systolic murmur is present with a grade of 3/6 at the upper left sternal border.    No gallop.  Pulmonary:     Effort: Pulmonary effort is normal.     Breath sounds: Normal breath sounds.  Abdominal:     General: Bowel sounds are normal.     Palpations: Abdomen is soft.  Musculoskeletal:        General: No swelling.     Laboratory examination:   Medications and allergies  No Known Allergies   Medication prior to this encounter:   Outpatient Medications Prior to Visit  Medication Sig Dispense Refill   levonorgestrel (MIRENA) 20 MCG/24HR IUD 1 each by Intrauterine route once.     Multiple Vitamin (MULTIVITAMIN) capsule Take 1 capsule by mouth daily.     aspirin EC 81 MG tablet Take 81 mg by mouth daily as needed for mild pain.      No facility-administered medications prior to visit.     Medication list after today's encounter   Current Outpatient Medications  Medication Instructions   levonorgestrel (MIRENA) 20 MCG/24HR IUD 1 each, Intrauterine,  Once   Multiple Vitamin (MULTIVITAMIN) capsule 1 capsule, Oral, Daily    Radiology:   No results found.  Cardiac Studies:   Holter Monitor   24 hours 05/07/2017: No symptoms reported. Rare PACs. There are frequent but insignificant number, 6000 PVCs in 24 hours. Occasional ventricular couplets and ventricular couplets noted.  Echocardiogram 05/07/2017: Normal LV systolic function, normal LV size.  Ejection fraction 56%. Mild tricuspid regurgitation.  No pulmonary hypertension. There is a globular, homogenous echodense structure measuring 1.2 x 1.1 cm attached to the lateral tricuspid leaflet, unchanged from prior study in 2016.  Differentials include lipoma, fibroblastoma.  EKG:   EKG 11/30/2020: Normal sinus rhythm at rate of 73 bpm, left axis deviation.   Right bundle branch block.  Poor R wave progression, cannot exclude anteroseptal infarct old.  No significant change from EKG 05/06/2017, previously incomplete right bundle branch block.    Assessment     ICD-10-CM   1. Tricuspid valve mass  I07.8 EKG 12-Lead    PCV ECHOCARDIOGRAM COMPLETE    2. Nonspecific abnormal electrocardiogram (ECG) (EKG)  R94.31        Medications Discontinued During This Encounter  Medication Reason   aspirin EC 81 MG tablet Error    No orders of the defined types were placed in this encounter.  Orders Placed This Encounter  Procedures   EKG 12-Lead   PCV ECHOCARDIOGRAM COMPLETE    Standing Status:   Future    Standing Expiration Date:   11/30/2021   Recommendations:   [name] is a 50 y.o.  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 Patient was referred back to me by neurology for frequent ectopy. Due to abnormal gait, she has been followed by Neurology and was diagnosed yesterday with muscular dystrophy.  I had last seen her in April 2019.  She presents for reestablishing care, accompanied by her biological aunt, mother.  No specific complaints.  Her examination reveals much more prominent systolic murmur in the left pulmonary area.  She also has a midsystolic click in the tricuspid area.  I will repeat an echocardiogram.  Unless I see a significant abnormality compared to previous, I will see her back on an annual basis.  No changes in the medications were done today.    Adrian Prows, MD, [facility] 11/30/2020, 11:18 AM Office: [phone]

## 2020-12-06 ENCOUNTER — Ambulatory Visit: Payer: Medicaid Other

## 2020-12-06 ENCOUNTER — Other Ambulatory Visit: Payer: Self-pay

## 2020-12-06 DIAGNOSIS — I078 Other rheumatic tricuspid valve diseases: Secondary | ICD-10-CM

## 2020-12-09 NOTE — Progress Notes (Signed)
Please let her adopted mother know that the small mass noted on the tricuspid valve has not changed since 2016.

## 2020-12-21 NOTE — Progress Notes (Signed)
Called patient, NA, LMAM

## 2020-12-31 NOTE — Progress Notes (Signed)
Called and spoke to pts mother, she voiced understanding.

## 2021-02-14 ENCOUNTER — Other Ambulatory Visit: Payer: Self-pay | Admitting: Family Medicine

## 2021-02-14 DIAGNOSIS — Z1231 Encounter for screening mammogram for malignant neoplasm of breast: Secondary | ICD-10-CM

## 2021-03-07 ENCOUNTER — Other Ambulatory Visit: Payer: Self-pay

## 2021-03-07 ENCOUNTER — Ambulatory Visit
Admission: RE | Admit: 2021-03-07 | Discharge: 2021-03-07 | Disposition: A | Discharge status: Home / Self Care | Payer: Medicaid Other | Source: Ambulatory Visit | Attending: Family Medicine | Admitting: Family Medicine

## 2021-03-07 DIAGNOSIS — Z1231 Encounter for screening mammogram for malignant neoplasm of breast: Secondary | ICD-10-CM

## 2021-03-26 ENCOUNTER — Other Ambulatory Visit: Payer: Self-pay | Admitting: Sports Medicine

## 2021-03-26 DIAGNOSIS — S8991XA Unspecified injury of right lower leg, initial encounter: Secondary | ICD-10-CM

## 2021-03-26 DIAGNOSIS — M25561 Pain in right knee: Secondary | ICD-10-CM

## 2022-02-25 ENCOUNTER — Other Ambulatory Visit: Payer: Self-pay | Admitting: Family Medicine

## 2022-02-25 DIAGNOSIS — Z1231 Encounter for screening mammogram for malignant neoplasm of breast: Secondary | ICD-10-CM

## 2022-03-11 ENCOUNTER — Ambulatory Visit: Payer: Medicaid Other

## 2022-04-30 ENCOUNTER — Ambulatory Visit
Admission: RE | Admit: 2022-04-30 | Discharge: 2022-04-30 | Disposition: A | Discharge status: Home / Self Care | Payer: Medicaid Other | Source: Ambulatory Visit | Attending: Family Medicine | Admitting: Family Medicine

## 2022-04-30 DIAGNOSIS — Z1231 Encounter for screening mammogram for malignant neoplasm of breast: Secondary | ICD-10-CM

## 2022-12-03 ENCOUNTER — Other Ambulatory Visit: Payer: Self-pay

## 2022-12-03 ENCOUNTER — Emergency Department (HOSPITAL_BASED_OUTPATIENT_CLINIC_OR_DEPARTMENT_OTHER): Payer: MEDICAID | Admitting: Radiology

## 2022-12-03 ENCOUNTER — Emergency Department (HOSPITAL_BASED_OUTPATIENT_CLINIC_OR_DEPARTMENT_OTHER)
Admission: EM | Admit: 2022-12-03 | Discharge: 2022-12-03 | Disposition: A | Discharge status: Home / Self Care | Payer: MEDICAID | Attending: Emergency Medicine | Admitting: Emergency Medicine

## 2022-12-03 DIAGNOSIS — S82041A Displaced comminuted fracture of right patella, initial encounter for closed fracture: Secondary | ICD-10-CM | POA: Insufficient documentation

## 2022-12-03 DIAGNOSIS — W0110XA Fall on same level from slipping, tripping and stumbling with subsequent striking against unspecified object, initial encounter: Secondary | ICD-10-CM | POA: Insufficient documentation

## 2022-12-03 DIAGNOSIS — Y9301 Activity, walking, marching and hiking: Secondary | ICD-10-CM | POA: Insufficient documentation

## 2022-12-03 DIAGNOSIS — S8001XA Contusion of right knee, initial encounter: Secondary | ICD-10-CM | POA: Diagnosis present

## 2022-12-03 DIAGNOSIS — S82001A Unspecified fracture of right patella, initial encounter for closed fracture: Secondary | ICD-10-CM

## 2022-12-03 NOTE — ED Triage Notes (Signed)
Walker to ambulate to triage. NAD. Accompanied by caregiver.   Fall 3 months ago fell and had fracture in knee. Fell again Saturday and is afraid of worsening previous injury in same knee. Right knee. Pain managed with NSAIDS.

## 2022-12-03 NOTE — ED Provider Notes (Signed)
[place] EMERGENCY DEPARTMENT AT [facility] Provider Note   CSN: [id number] Arrival date & time: 12/03/22  1242     History  Chief Complaint  Patient presents with   [name]    [name] is a 52 y.o. female.  Patient is here with her care provider who reports that patient fell on Sunday and struck her right knee.  Patient has swelling and bruising to her right knee.  Patient has been walking with a walker.  Patient had a fracture to her patella 3 months ago.  Her care provider reports that she tried to take her to the orthopedist but they were unable to get her in.  Care provider is concerned that patient may have made injury worse.  Patient does not have any other area of injury.  She did not strike her head she did not lose consciousness  The history is provided by the patient. No language interpreter was used.  Fall       Home Medications Prior to Admission medications   Medication Sig Start Date End Date Taking? Authorizing Provider  levonorgestrel (MIRENA) 20 MCG/24HR IUD 1 each by Intrauterine route once.    [provider]  Multiple Vitamin (MULTIVITAMIN) capsule Take 1 capsule by mouth daily.    [provider]      Allergies    Patient has no known allergies.    Review of Systems   Review of Systems  Musculoskeletal:  Positive for gait problem and joint swelling.  All other systems reviewed and are negative.   Physical Exam Updated Vital Signs BP 105/77   Pulse 72   Temp (!) 97.2 F (36.2 C) (Temporal)   Resp 16   LMP  (LMP Unknown)   SpO2 97%  Physical Exam Vitals and nursing note reviewed.  Constitutional:      Appearance: She is well-developed.  HENT:     Head: Normocephalic.  Cardiovascular:     Rate and Rhythm: Normal rate.  Pulmonary:     Effort: Pulmonary effort is normal.  Musculoskeletal:        General: Swelling and tenderness present.     Cervical back: Normal range of motion.     Comments: Swollen  tender right knee, effusion large area of bruising, pain with range of motion neurovascular neurosensory intact  Skin:    General: Skin is warm.  Neurological:     General: No focal deficit present.     Mental Status: She is alert and oriented to person, place, and time.     ED Results / Procedures / Treatments   Labs (all labs ordered are listed, but only abnormal results are displayed) Labs Reviewed - No data to display  EKG None  Radiology DG Knee Complete 4 Views Right  Result Date: 12/03/2022 CLINICAL DATA:  Fall.  Knee pain and swelling. EXAM: RIGHT KNEE - COMPLETE 4+ VIEW COMPARISON:  Right knee radiograph dated October 12, 2020. FINDINGS: There is an acute transverse fracture of the inferior patella with approximately 1.7 cm of retraction of the inferior patellar pole fracture fragment. No additional fracture identified. There is a moderate-sized knee joint effusion. Diffuse soft tissue swelling, most pronounced anteriorly. IMPRESSION: Acute displaced transverse fracture of the inferior patellar pole. Moderate-sized knee joint effusion. Electronically Signed   By: Hart Robinsons M.D.   On: 12/03/2022 16:13    Procedures Procedures    Medications Ordered in ED Medications - No data to display  ED Course/ Medical Decision Making/  A&P                                 Medical Decision Making Patient fell on Sunday and struck her right knee.  Patient had a patella fracture 3 months ago.  Amount and/or Complexity of Data Reviewed Independent Historian: parent    Details: Is here with her mother who is supportive External Data Reviewed: notes.    Details: [facility] orthopedist notes reviewed Radiology: ordered and independent interpretation performed. Decision-making details documented in ED Course.    Details: X-ray ordered reviewed and interpreted.  X-ray shows acute displaced transverse fracture of the inferior patella pole with a moderate-sized joint effusion.  This  appears to be a new injury  Risk Risk Details: I discussed x-ray findings with patient's mother.  I have advised she needs to follow-up with her orthopedist for further evaluation.  Patient is to use walker she is placed in a knee immobilizer.           Final Clinical Impression(s) / ED Diagnoses Final diagnoses:  Closed displaced fracture of right patella, unspecified fracture morphology, initial encounter    Rx / DC Orders ED Discharge Orders     None      An After Visit Summary was printed and given to the patient.    Elson Areas, PA-C 12/03/22 1818    Elayne Snare K, DO 12/03/22 2029

## 2022-12-03 NOTE — ED Notes (Signed)
Dc instructions reviewed with patient. Patient voiced understanding. Dc with belongings.  °

## 2022-12-03 NOTE — Discharge Instructions (Addendum)
Call Orthopaedist at [facility] to be seen for evaluation.  Ice to area of swelling.  Return if any problems.

## 2022-12-03 NOTE — ED Notes (Signed)
Spoke with [place] in [place] regarding xray films to be placed on CD for patient

## 2023-01-14 ENCOUNTER — Other Ambulatory Visit (HOSPITAL_COMMUNITY)
Admission: RE | Admit: 2023-01-14 | Discharge: 2023-01-14 | Disposition: A | Discharge status: Home / Self Care | Payer: MEDICAID | Source: Ambulatory Visit | Attending: Obstetrics & Gynecology | Admitting: Obstetrics & Gynecology

## 2023-01-14 ENCOUNTER — Ambulatory Visit (INDEPENDENT_AMBULATORY_CARE_PROVIDER_SITE_OTHER): Payer: MEDICAID

## 2023-01-14 VITALS — BP 131/87 | HR 115 | Ht 63.0 in | Wt 102.5 lb

## 2023-01-14 DIAGNOSIS — B9689 Other specified bacterial agents as the cause of diseases classified elsewhere: Secondary | ICD-10-CM | POA: Diagnosis not present

## 2023-01-14 DIAGNOSIS — Z975 Presence of (intrauterine) contraceptive device: Secondary | ICD-10-CM

## 2023-01-14 DIAGNOSIS — N760 Acute vaginitis: Secondary | ICD-10-CM | POA: Insufficient documentation

## 2023-01-14 DIAGNOSIS — G7111 Myotonic muscular dystrophy: Secondary | ICD-10-CM | POA: Diagnosis not present

## 2023-01-14 DIAGNOSIS — F79 Unspecified intellectual disabilities: Secondary | ICD-10-CM

## 2023-01-14 DIAGNOSIS — Z30431 Encounter for routine checking of intrauterine contraceptive device: Secondary | ICD-10-CM | POA: Diagnosis not present

## 2023-01-14 NOTE — Progress Notes (Signed)
Patient ID: [name], female   DOB: 07-Oct-1970, 52 y.o.   MRN: [id number]  Chief Complaint  Patient presents with   GYN   Vaginal discharge HPI [name] is a 52 y.o. female.  G0P0000 Patient is in her mother's care, with h/o mental impairment. She has noticed vaginal discharge and requests testing and to check her Mirena, in place for 6 + years  HPI  Past Medical History:  Diagnosis Date   Fibroids    Mental disorder    Myotonic dystrophy, type 1 (HCC) 05/20/2017    Past Surgical History:  Procedure Laterality Date   thumb surgery      Family History  Problem Relation Age of Onset   Breast cancer Mother    Breast cancer Maternal Aunt 77    Social History Social History   Tobacco Use   Smoking status: Never   Smokeless tobacco: Never  Vaping Use   Vaping status: Never Used  Substance Use Topics   Alcohol use: No    Alcohol/week: 0.0 standard drinks of alcohol   Drug use: No    No Known Allergies  Current Outpatient Medications  Medication Sig Dispense Refill   levonorgestrel (MIRENA) 20 MCG/24HR IUD 1 each by Intrauterine route once.     Multiple Vitamin (MULTIVITAMIN) capsule Take 1 capsule by mouth daily.     No current facility-administered medications for this visit.    Review of Systems Review of Systems  Constitutional: Negative.   Respiratory: Negative.    Cardiovascular: Negative.   Gastrointestinal: Negative.   Genitourinary:  Positive for vaginal discharge. Negative for pelvic pain and vaginal bleeding.    Blood pressure 131/87, pulse (!) 115, height 5\' 3"  (1.6 m), weight 102 lb 8 oz (46.5 kg).  Physical Exam Physical Exam Vitals and nursing note reviewed. Exam conducted with a chaperone present.  Constitutional:      Appearance: She is not ill-appearing.  Cardiovascular:     Rate and Rhythm: Normal rate.  Pulmonary:     Effort: Pulmonary effort is normal.  Abdominal:     General: Abdomen is flat.  Genitourinary:    General:  Normal vulva.     Exam position: Lithotomy position.     Vagina: Vaginal discharge and bleeding (bleeding noticed likely due to atrophy) present.     Adnexa:        Left: No mass (.diag).       Comments: No bimanual, poor tolerance to examination Skin:    General: Skin is warm and dry.   Her mother is present   Data Reviewed Pap result nl  Assessment Mental impairment  Myotonic dystrophy, type 1 (HCC)  IUD (intrauterine device) in place - Plan: Cervicovaginal ancillary only( [place]) Vaginal discharge with atrophy present  Plan F/u on testing  Scheduled to return 02/2023    Scheryl Darter 01/14/2023, 11:51 AM

## 2023-01-14 NOTE — Progress Notes (Signed)
Pt is in the office with mother reporting vaginal discharge.  Currently has IUD in place Pt has mental impairment and mother states that she noticed the discharge a few days ago and wants her IUD and discharge to be checked.

## 2023-01-16 LAB — CERVICOVAGINAL ANCILLARY ONLY
Bacterial Vaginitis (gardnerella): POSITIVE — AB
Candida Glabrata: NEGATIVE
Candida Vaginitis: NEGATIVE
Chlamydia: NEGATIVE
Comment: NEGATIVE
Comment: NEGATIVE
Comment: NEGATIVE
Comment: NEGATIVE
Comment: NEGATIVE
Comment: NORMAL
Neisseria Gonorrhea: NEGATIVE
Trichomonas: NEGATIVE

## 2023-02-25 ENCOUNTER — Ambulatory Visit: Payer: MEDICAID | Admitting: Obstetrics and Gynecology

## 2023-02-25 ENCOUNTER — Encounter: Payer: Self-pay | Admitting: Obstetrics and Gynecology

## 2023-02-25 VITALS — BP 133/82 | HR 78 | Wt 99.2 lb

## 2023-02-25 DIAGNOSIS — Z975 Presence of (intrauterine) contraceptive device: Secondary | ICD-10-CM

## 2023-02-25 DIAGNOSIS — Z1211 Encounter for screening for malignant neoplasm of colon: Secondary | ICD-10-CM | POA: Diagnosis not present

## 2023-02-25 DIAGNOSIS — F79 Unspecified intellectual disabilities: Secondary | ICD-10-CM

## 2023-02-25 DIAGNOSIS — Z01419 Encounter for gynecological examination (general) (routine) without abnormal findings: Secondary | ICD-10-CM | POA: Diagnosis not present

## 2023-02-25 NOTE — Progress Notes (Signed)
 Pt. Presents for annual, pt. Has no questions or concerns at this time.

## 2023-02-25 NOTE — Progress Notes (Signed)
   ANNUAL EXAM Patient name: [name] MRN [id number]  Date of birth: [birth year] Chief Complaint:   Annual Exam  History of Present Illness:   [name] is a 53 y.o. G0P0000  female being seen today for a routine annual exam.  Current complaints: Here with mother who provides history as well. Was seen 12/04 for vaginal d/c and check IUD. Mirena in place for 6 years, placed  08/28/16. With [facility] Reports no vaginal or pelvic complaints today   No LMP recorded (lmp unknown). Patient is postmenopausal.   Last pap 06/26/20. Results were: NILM w/ HRHPV negative. H/O abnormal pap: no Last mammogram: 05/01/22. Results were: normal. Family h/o breast cancer: no Last colonoscopy: unsure, reports more than 10 years, support person requesting referral. Results were: normal. Family h/o colorectal cancer: no      No data to display               No data to display           Review of Systems:   Pertinent items are noted in HPI Denies any headaches, blurred vision, fatigue, shortness of breath, chest pain, abdominal pain, abnormal vaginal discharge/itching/odor/irritation, problems with periods, bowel movements, urination, or intercourse unless otherwise stated above. Pertinent History Reviewed:  Reviewed past medical,surgical, social and family history.  Reviewed problem list, medications and allergies. Physical Assessment:   Vitals:   02/25/23 1019  BP: 133/82  Pulse: 78  Weight: 99 lb 3.2 oz (45 kg)  Body mass index is 17.57 kg/m.        Physical Examination:   General appearance - well appearing, and in no distress  Mental status - alert, oriented to person, place, and time  Psych:  She has a normal mood and affect  Skin - warm and dry  Chest - effort normal  Heart - normal rate and regular rhythm  Neck:  midline trachea  Breasts - breasts appear normal, no suspicious masses, no skin or nipple changes or  axillary nodes  Abdomen - soft  Pelvic - deferred,  exam 01/14/23  Chaperone present for exam  No results found for this or any previous visit (from the past 24 hours).  Assessment & Plan:  1. Well woman exam (Primary) Up to date, pap due 2027 Mammogram to be scheduled in March IUD to be exchanged in 2026  2. Screen for colon cancer Requesting referral to Dr. Tova Fresh for colonoscopy - Amb Referral to Colonoscopy  3. IUD (intrauterine device) in place Checked 01/14/23  4. Mental impairment    Labs/procedures today:   Mammogram:  to be scheduled in March , or sooner if problems Colonoscopy: schedule screening colonoscopy as soon as possible, or sooner if problems  Orders Placed This Encounter  Procedures   Amb Referral to Colonoscopy    Meds: No orders of the defined types were placed in this encounter.   Follow-up: Return in about 1 year (around 02/25/2024) for Zoanne Hinders, FNP

## 2023-04-14 ENCOUNTER — Other Ambulatory Visit: Payer: Self-pay | Admitting: Family Medicine

## 2023-04-14 DIAGNOSIS — Z1231 Encounter for screening mammogram for malignant neoplasm of breast: Secondary | ICD-10-CM

## 2023-05-01 ENCOUNTER — Ambulatory Visit: Payer: MEDICAID

## 2023-05-20 ENCOUNTER — Ambulatory Visit
Admission: RE | Admit: 2023-05-20 | Discharge: 2023-05-20 | Disposition: A | Discharge status: Home / Self Care | Payer: MEDICAID | Source: Ambulatory Visit | Attending: Family Medicine | Admitting: Family Medicine

## 2023-05-20 DIAGNOSIS — Z1231 Encounter for screening mammogram for malignant neoplasm of breast: Secondary | ICD-10-CM

## 2023-05-21 IMAGING — MG MM DIGITAL SCREENING BILAT W/ TOMO AND CAD
6 of 10 series · 6 of 30 positions shown · non-contrast
Comparison: Previous exam(s).

CLINICAL DATA: Screening.

EXAM:
DIGITAL SCREENING BILATERAL MAMMOGRAM WITH TOMOSYNTHESIS AND CAD
TECHNIQUE: Bilateral screening digital craniocaudal and mediolateral oblique
mammograms were obtained. Bilateral screening digital breast
tomosynthesis was performed. The images were evaluated with
computer-aided detection.

[L MLO synth-2D]
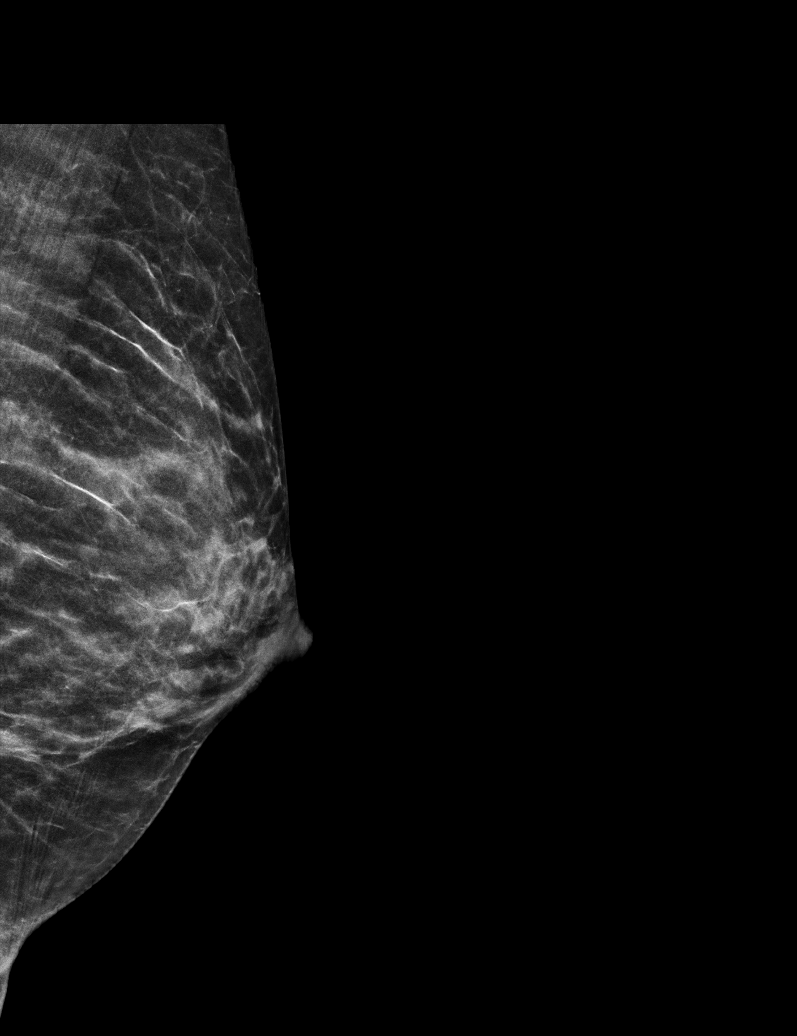

[L CC synth-2D (1 of 2)]
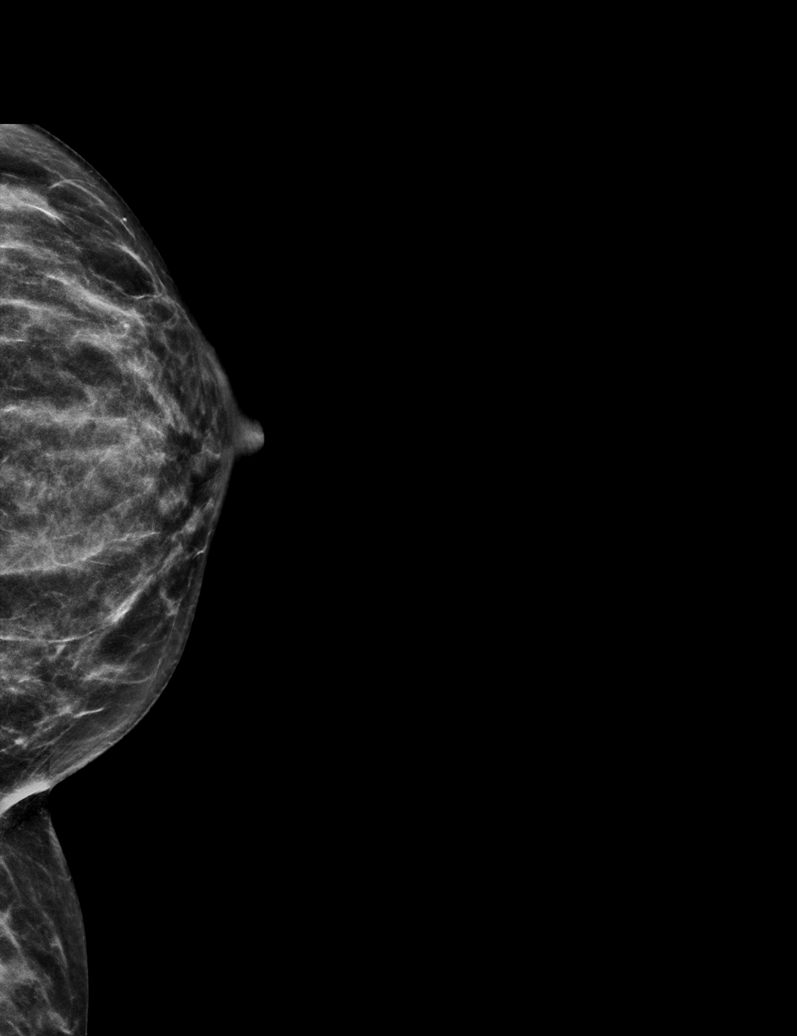

[R CC synth-2D]
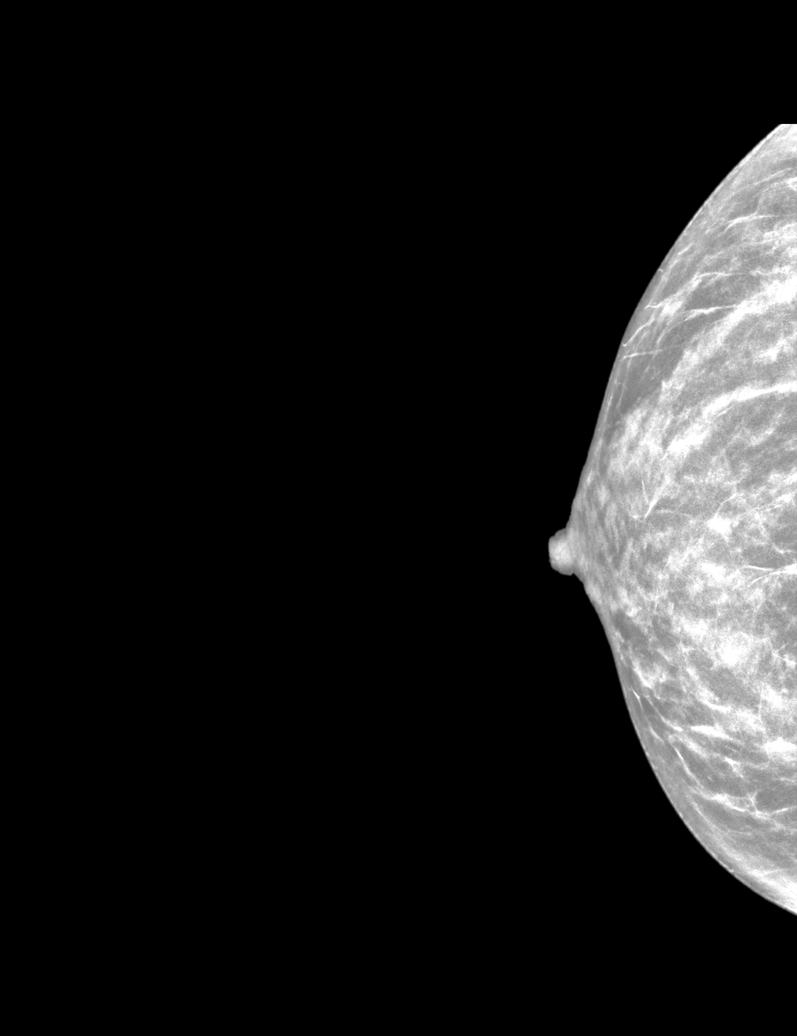

[L CC synth-2D (2 of 2)]
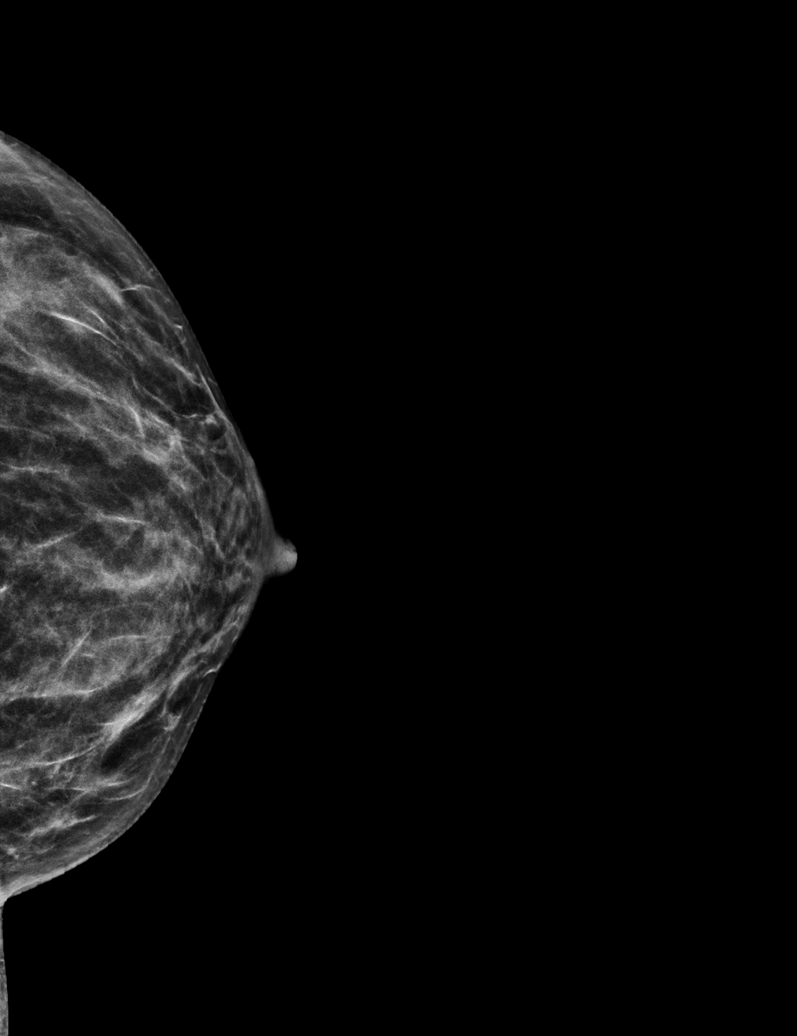

[R MLO synth-2D]
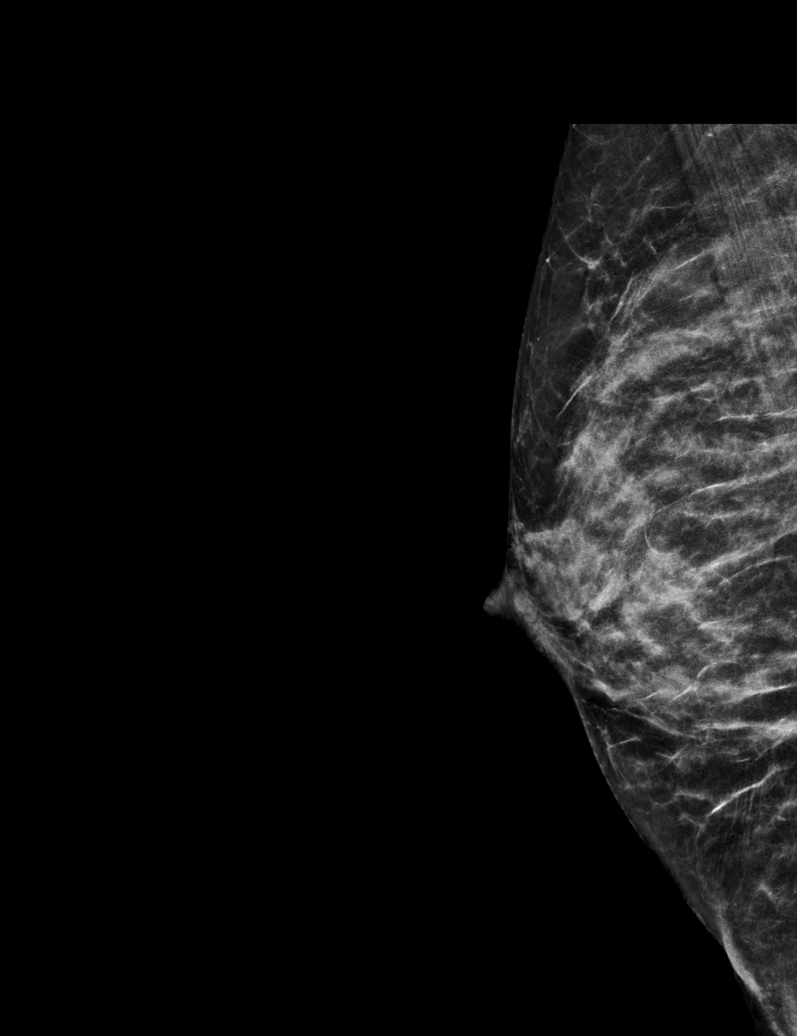

[L MLO tomo · tomo slice 24/47.0]
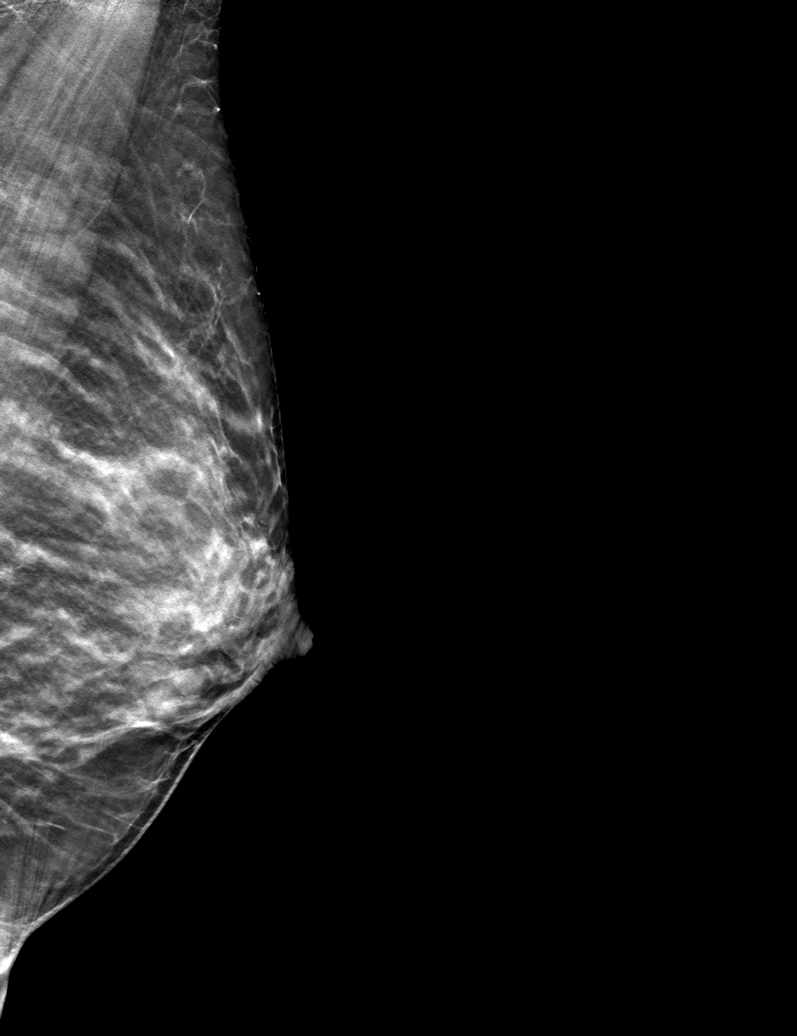

[6 of 30 positions shown; findings below may reference images not displayed]

ACR Breast Density Category c: The breast tissue is heterogeneously
dense, which may obscure small masses.
FINDINGS: There are no findings suspicious for malignancy.
IMPRESSION: No mammographic evidence of malignancy. A result letter of this
screening mammogram will be mailed directly to the patient.

RECOMMENDATION:
Screening mammogram in one year. (Code:[id number])

BI-RADS CATEGORY  1: Negative.

## 2023-08-03 ENCOUNTER — Ambulatory Visit: Payer: MEDICAID | Attending: Cardiology | Admitting: Cardiology

## 2023-08-03 NOTE — Progress Notes (Deleted)
 Cardiology Office Note:  .   Date:  08/03/2023  ID:  [name], DOB 03-29-1970, MRN [id number] PCP: Rena Luke POUR, MD  [facility] Providers Cardiologist:  None { Click to update primary MD,subspecialty MD or APP then REFRESH:1}  History of Present Illness: [name]   [name] is a 53 y.o. 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 Her mother (Biologic aunt) who has raised her is present.  Discussed the use of AI scribe software for clinical note transcription with the patient, who gave verbal consent to proceed.  History of Present Illness    Labs   Lab Results  Component Value Date   CHOL 189 04/02/2016   HDL 70 04/02/2016   TRIG 100 04/02/2016   Lab Results  Component Value Date   NA 147 (H) 09/09/2017   K 4.0 09/09/2017   CO2 30 09/09/2017   GLUCOSE 117 (H) 09/09/2017   BUN 15 09/09/2017   CREATININE 0.83 09/09/2017   CALCIUM 9.1 09/09/2017   GFRNONAA >60 09/09/2017      Latest Ref Rng & Units 09/09/2017    5:34 PM 04/02/2016    3:12 PM 04/24/2015    7:19 PM  BMP  Glucose 70 - 99 mg/dL 882  91  898   BUN 6 - 20 mg/dL 15  11  7    Creatinine 0.44 - 1.00 mg/dL 9.16  9.50  9.38   BUN/Creat Ratio 9 - 23  22    Sodium 135 - 145 mmol/L 147  144  143   Potassium 3.5 - 5.1 mmol/L 4.0  4.3  3.1   Chloride 98 - 111 mmol/L 109  103  101   CO2 22 - 32 mmol/L 30  24  29    Calcium 8.9 - 10.3 mg/dL 9.1  8.8  9.0       Latest Ref Rng & Units 09/09/2017    5:34 PM 04/02/2016    3:12 PM 04/24/2015    7:19 PM  CBC  WBC 4.0 - 10.5 K/uL 4.5  4.6  6.2   Hemoglobin 12.0 - 15.0 g/dL 86.8  89.8  9.5   Hematocrit 36.0 - 46.0 % 45.0  34.2  33.1   Platelets 150 - 400 K/uL 191  278  526    Lab Results  Component Value Date   HGBA1C 5.4 07/14/2014    Lab Results  Component Value Date   TSH 1.930 04/02/2016    External Labs:  ***  ROS  ***ROS  Physical Exam:   VS:  LMP  (LMP  Unknown)    Wt Readings from Last 3 Encounters:  02/25/23 99 lb 3.2 oz (45 kg)  01/14/23 102 lb 8 oz (46.5 kg)  11/30/20 126 lb 6.4 oz (57.3 kg)    ***Physical Exam Studies Reviewed: [name]    ECHOCARDIOGRAM COMPLETE 12/06/2020 Normal LV systolic function with EF 62%. Left ventricle cavity is normal in size. Normal left ventricular wall thickness. Normal global wall motion. Normal diastolic filling pattern. Calculated EF 62%. Structurally normal tricuspid valve.  Mild tricuspid regurgitation. No evidence of pulmonary hypertension. Globular, homogenous, echodense structure measuring 1.0X1.0 cm, attached to lateral tricuspid leaflet. Most consistent with a small lipoma. Overall, no significant change from 05/07/2017 and 2016 study.   EKG:       EKG 11/30/2020: Normal sinus rhythm at rate of 73 bpm, left axis deviation.  Right bundle branch block.  Poor R wave progression, cannot exclude anteroseptal infarct old.   Medications ordered    No orders of the defined types were placed in this encounter.    ASSESSMENT AND PLAN: .      ICD-10-CM   1. 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 [place], [place] [address] Phone: [phone]. Fax:  [phone]

## 2023-08-05 ENCOUNTER — Encounter: Payer: Self-pay | Admitting: Cardiology

## 2023-09-11 ENCOUNTER — Ambulatory Visit: Payer: MEDICAID | Attending: Cardiology | Admitting: Cardiology

## 2023-09-11 ENCOUNTER — Other Ambulatory Visit (HOSPITAL_COMMUNITY): Payer: Self-pay

## 2023-09-11 ENCOUNTER — Encounter: Payer: Self-pay | Admitting: Cardiology

## 2023-09-11 VITALS — BP 145/84 | HR 68 | Resp 16 | Ht 63.0 in | Wt 106.4 lb

## 2023-09-11 DIAGNOSIS — I491 Atrial premature depolarization: Secondary | ICD-10-CM

## 2023-09-11 DIAGNOSIS — I078 Other rheumatic tricuspid valve diseases: Secondary | ICD-10-CM

## 2023-09-11 MED ORDER — VERAPAMIL HCL ER 120 MG PO TBCR
120.0000 mg | EXTENDED_RELEASE_TABLET | ORAL | 1 refills | Status: AC
  Filled 2023-09-11: qty 90, 90d supply, fill #0
  Filled 2023-09-11: qty 30, 30d supply, fill #0

## 2023-09-11 NOTE — Patient Instructions (Signed)
 Medication Instructions:  Your physician has recommended you make the following change in your medication: Start Verapamil  120 mg by mouth daily in the morning  *If you need a refill on your cardiac medications before your next appointment, please call your pharmacy*  Lab Work: none If you have labs (blood work) drawn today and your tests are completely normal, you will receive your results only by: MyChart Message (if you have MyChart) OR A paper copy in the mail If you have any lab test that is abnormal or we need to change your treatment, we will call you to review the results.  Testing/Procedures: none  Follow-Up: At [facility], you and your health needs are our priority.  As part of our continuing mission to provide you with exceptional heart care, our providers are all part of one team.  This team includes your primary Cardiologist (physician) and Advanced Practice Providers or APPs (Physician Assistants and Nurse Practitioners) who all work together to provide you with the care you need, when you need it.  Your next appointment:   As needed  Provider:   Gordy Bergamo, MD    We recommend signing up for the patient portal called MyChart.  Sign up information is provided on this After Visit Summary.  MyChart is used to connect with patients for Virtual Visits (Telemedicine).  Patients are able to view lab/test results, encounter notes, upcoming appointments, etc.  Non-urgent messages can be sent to your provider as well.   To learn more about what you can do with MyChart, go to [email or web address].   Other Instructions

## 2023-09-11 NOTE — Progress Notes (Signed)
 Cardiology Office Note:  .   Date:  09/11/2023  ID:  [name], DOB Sep 09, 1970, MRN [id number] PCP: Rena Luke POUR, MD   [facility]Care Providers Cardiologist:  Gordy Bergamo, MD   History of Present Illness: [name]   [name] is a 53 y.o. 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   Echocardiogram 12/06/2020 revealing normal LVEF, globular homogeneous echodense structure measuring 1 x 1 cm attached to the lateral tricuspid leaflet most consistent with small lipoma, no change from 05/07/2017 and 2016 study.  Cardiac MR 05/02/2014: Mild tricuspid regurgitation, suspicion of valve thickening versus mass on the right ventricular side of the tricuspid valve measuring 24 x 14 mm. Discussed the use of AI scribe software for clinical note transcription with the patient, who gave verbal consent to proceed.  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  No chest pain or shortness of breath. Cholesterol levels are within acceptable ranges, with a total cholesterol of 194 mg/dL, LDL mildly high, and HDL at 62 mg/dL. Thyroid function tested a year ago showed a normal TSH level of 0.953. Weight has remained stable.  Labs   ROS  Review of Systems  Cardiovascular:  Negative for chest pain, dyspnea on exertion and leg swelling.   Physical Exam:   VS:  BP (!) 145/84 (BP Location: Left Arm, Patient Position: Sitting, Cuff Size: Normal)   Pulse 68   Resp 16   Ht 5' 3 (1.6 m)   Wt 106 lb 6.4 oz (48.3 kg)   LMP  (LMP Unknown)   SpO2 99%   BMI 18.85 kg/m    Wt Readings from Last 3 Encounters:  09/11/23 106 lb 6.4 oz (48.3 kg)  02/25/23 99 lb 3.2 oz (45 kg)  01/14/23 102 lb 8 oz (46.5 kg)    Physical  Exam Constitutional:      Appearance: She is underweight.  Neck:     Vascular: No carotid bruit or JVD.  Cardiovascular:     Rate and Rhythm: Normal rate and regular rhythm.     Pulses: Intact distal pulses.     Heart sounds: Normal heart sounds. No murmur heard.    No gallop.  Pulmonary:     Effort: Pulmonary effort is normal.     Breath sounds: Normal breath sounds.  Abdominal:     General: Bowel sounds are normal.     Palpations: Abdomen is soft.  Musculoskeletal:     Right lower leg: No edema.     Left lower leg: No edema.    Studies Reviewed: [name]     EKG:    EKG Interpretation Date/Time:  Friday September 11 2023 08:50:36 EDT Ventricular Rate:  117 PR Interval:  272 QRS Duration:  92 QT Interval:  342 QTC Calculation: 477 R Axis:   251  Text Interpretation: EKG 09/11/2023: Sinus rhythm with first-degree AV block at the rate of 117 bpm, right axis deviation, consider RVH.  Incomplete right bundle branch block.  Frequent PACs in bigeminal pattern.  Single PVC.  Compared to 11/30/2020, atrial bigeminy is new. Confirmed by Jozelyn Kuwahara, Jagadeesh ([id number]) on 09/11/2023 9:15:49 AM    Medications ordered    Meds ordered  this encounter  Medications   verapamil  (CALAN -SR) 120 MG CR tablet    Sig: Take 1 tablet (120 mg total) by mouth every morning.    Dispense:  90 tablet    Refill:  1    Future refills with Luke Manns, MD     ASSESSMENT AND PLAN: .      ICD-10-CM   1. Tricuspid valve mass  I07.8 EKG 12-Lead    2. PAC (premature atrial contraction)  I49.1 verapamil  (CALAN -SR) 120 MG CR tablet     Assessment & Plan Frequent premature atrial contractions (PACs) with tachycardia Frequent PACs with elevated heart rate on EKG. Normal thyroid function tests from a year ago suggest non-thyroidal cause. Dehydration is a contributing factor. - Prescribe verapamil  120 mg once daily to manage heart rate. - Increase dose to 180 mg if heart rate remains above 100 bpm and blood pressure is  stable. - Advise thyroid function check with primary doctor at next visit. - Send prescription to pharmacy.  Tricuspid valve mass, stable 1 cm tricuspid valve mass stable since 2016 with no change in size. - No current intervention planned.  Hypernatremia likely due to dehydration Elevated sodium levels likely due to dehydration. - Encourage increased water intake to manage sodium levels.  No endocarditis prophylaxis is indicated.  I will see her back on a as needed basis.    Signed,  Gordy Bergamo, MD, [facility] 09/11/2023, 6:27 PM [facility] [address] [place], [place] [address] Phone: [phone]. Fax:  [phone]
